# Patient Record
Sex: Female | Born: 1990 | Race: White | Hispanic: No | Marital: Married | Smoking: Never smoker
Health system: Southern US, Community
[De-identification: ages and names within clinical notes are randomized; demographics above are authoritative.]

## PROBLEM LIST (undated history)

## (undated) ENCOUNTER — Inpatient Hospital Stay (HOSPITAL_COMMUNITY): Payer: Self-pay

## (undated) DIAGNOSIS — E063 Autoimmune thyroiditis: Secondary | ICD-10-CM

## (undated) DIAGNOSIS — L509 Urticaria, unspecified: Secondary | ICD-10-CM

## (undated) DIAGNOSIS — N390 Urinary tract infection, site not specified: Secondary | ICD-10-CM

## (undated) DIAGNOSIS — Z789 Other specified health status: Secondary | ICD-10-CM

## (undated) HISTORY — PX: BLADDER SURGERY: SHX569

## (undated) HISTORY — PX: TYMPANOSTOMY TUBE PLACEMENT: SHX32

## (undated) HISTORY — PX: ADENOIDECTOMY: SUR15

## (undated) HISTORY — PX: TUBAL LIGATION: SHX77

## (undated) HISTORY — DX: Urticaria, unspecified: L50.9

## (undated) HISTORY — PX: TONSILLECTOMY: SUR1361

## (undated) HISTORY — PX: HERNIA REPAIR: SHX51

---

## 2017-06-24 ENCOUNTER — Other Ambulatory Visit (HOSPITAL_COMMUNITY): Payer: Self-pay | Admitting: Obstetrics and Gynecology

## 2017-06-24 ENCOUNTER — Encounter (HOSPITAL_COMMUNITY): Payer: Self-pay | Admitting: Obstetrics and Gynecology

## 2017-06-24 DIAGNOSIS — Z3A19 19 weeks gestation of pregnancy: Secondary | ICD-10-CM

## 2017-06-24 DIAGNOSIS — Z3689 Encounter for other specified antenatal screening: Secondary | ICD-10-CM

## 2017-06-24 DIAGNOSIS — R9389 Abnormal findings on diagnostic imaging of other specified body structures: Secondary | ICD-10-CM

## 2017-07-03 ENCOUNTER — Encounter (HOSPITAL_COMMUNITY): Payer: Self-pay | Admitting: *Deleted

## 2017-07-07 ENCOUNTER — Encounter (HOSPITAL_COMMUNITY): Payer: Self-pay

## 2017-07-07 ENCOUNTER — Ambulatory Visit (HOSPITAL_COMMUNITY)
Admission: RE | Admit: 2017-07-07 | Discharge: 2017-07-07 | Disposition: A | Discharge status: Home / Self Care | Payer: BLUE CROSS/BLUE SHIELD | Source: Ambulatory Visit | Attending: Obstetrics and Gynecology | Admitting: Obstetrics and Gynecology

## 2017-07-07 ENCOUNTER — Other Ambulatory Visit (HOSPITAL_COMMUNITY): Payer: Self-pay | Admitting: *Deleted

## 2017-07-07 DIAGNOSIS — Z3689 Encounter for other specified antenatal screening: Secondary | ICD-10-CM

## 2017-07-07 DIAGNOSIS — Z3A19 19 weeks gestation of pregnancy: Secondary | ICD-10-CM

## 2017-07-07 DIAGNOSIS — O359XX0 Maternal care for (suspected) fetal abnormality and damage, unspecified, not applicable or unspecified: Secondary | ICD-10-CM

## 2017-07-07 DIAGNOSIS — O350XX0 Maternal care for (suspected) central nervous system malformation in fetus, not applicable or unspecified: Secondary | ICD-10-CM | POA: Insufficient documentation

## 2017-07-07 DIAGNOSIS — R9389 Abnormal findings on diagnostic imaging of other specified body structures: Secondary | ICD-10-CM

## 2017-07-07 DIAGNOSIS — O3500X0 Maternal care for (suspected) central nervous system malformation or damage in fetus, unspecified, not applicable or unspecified: Secondary | ICD-10-CM | POA: Insufficient documentation

## 2017-07-07 HISTORY — DX: Other specified health status: Z78.9

## 2017-07-07 NOTE — Progress Notes (Addendum)
Genetic Counseling  High-Risk Gestation Note  Appointment Date:  07/07/2017 Referred By: Hassell Done, MD Date of Birth:  1991/09/10   Pregnancy History: G2P1001 Estimated Date of Delivery: 11/30/17 Estimated Gestational Age: [redacted]w[redacted]d Attending: Alpha Gula, MD   I met with Ms. Jasmine Avila and her partner for genetic counseling because of abnormal ultrasound findings.   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  Discussed in utero fetal repair and postnatal surgical repair  Patient understands need to transfer care to deliver at tertiary care facility  Interested in obtaining more information on in utero ONTD repair- we will facilitate consultation with [facility]   May pursue prenatal consultation with neurosurgery at other medical center pending consult with [facility] regarding fetal surgery  Reviewed options for additional screening  Ongoing ultrasound- follow up scheduled in 4 weeks  Reviewed options for diagnostic testing, including risks, benefits, limitations and alternatives  Amniocentesis declined today, but patient understands this is part of eligibility criteria if elect to pursue in utero repair  Reviewed family history concerns  We began by reviewing the ultrasound in detail. Ultrasound today confirmed the finding of a sacral open neural tube defect (NTD) and banana sign (Arnold Chiari II malformation).  The fetal ventricles were not visualized to be dilated at the time of today's ultrasound.  We discussed that these findings are consistent with a diagnosis of open spina bifida.  Remaining visualized fetal anatomy was within normal limits. Complete ultrasound results under separate cover.   We discussed that spina bifida is one of the most common birth differences and affects approximately 1 in 600 babies each year in [place].  We reviewed that the spine contains nerves that control the  legs, bladder, and bowel.  If there is an opening or a change in the development of the spine, the nerves can be damaged or incompletely formed.  We also discussed that babies with spina bifida can have a range of health concerns, which partially depends on where the opening is located along the spine.  Typically, openings in the lower spine cause fewer health concerns than openings in the upper spine.  Individuals with spina bifida may have muscle weakness, some degree of paralysis in the lower limbs, difficulty controlling bladder and bowel function, fluid build-up in the ventricles of the brain (ventriculomegaly), and learning differences.  The degree of paralysis, possible learning problems, and related health concerns that a baby with spina bifida may have cannot be accurately predicted until after birth.    We discussed that in most cases, surgery to close the opening in the spine is performed within the first 24 to 48 hours after birth.  Immediate surgery is required to prevent infection and any further damage to the nerves that are exposed. We discussed the need for delivery at a tertiary care center.  We also discussed the option of in utero NTD repair, which is available at several centers throughout the country. Fetal surgery for ONTDs has been shown to reduce the need for VP shunting and has demonstrated to improve mobility in some cases. We spent some time discussing the eligibility criteria for in utero repair and briefly discussed risks versus benefits. Fetal surgery is typically performed between 24 and [redacted] weeks gestation, and we discussed that this is available at [facility]. Ms. Jasmine Avila and her partner expressed interest in obtaining more information regarding in utero repair, but are currently undecided  if they will elect to pursue this option. They requested a referral for consultation to [facility] to obtain more information about in utero NTD repair. We will facilitate  this referral.   We also discussed the option of this couple meeting with a pediatric neurosurgeon at other medical centers including [place] to discuss and coordinate any necessary surgeries following the delivery, if desired. The couple may consider prenatal consultation with neurosurgery at other locations pending their visit with [facility].   We discussed the many causes of NTDs.  They were informed that spina bifida occurs as an isolated finding, in the majority of cases.  "Isolated" simply means that spina bifida is the only birth difference that happened in the baby.  Isolated NTDs are usually inherited in a multifactorial manner in which there is no prior family history.  Multifactorial conditions have both environmental and genetic factors that contribute to their development.  Both the genetic and environmental factors that contribute to the development of spina bifida are largely unknown; however, some medications and health conditions, such as uncontrolled diabetes and obesity, may increase the chance of spina bifida.  We also discussed the role of folic acid in the development of the neural tube and prevention of spina bifida.  Ms. Jasmine Avila reported that she was not taking any medications that are known to increase the risk of spina bifida.  We also discussed that NTDs may occur as a feature of an underlying genetic syndrome or condition.  Approximately 5-10% of individuals who have spina bifida also have an underlying chromosome condition. Ms. Jasmine Avila previously had first trimester screening, which was within normal limits, reducing the risks for fetal Down syndrome and trisomy 79. We reviewed benefits and limitations of first trimester screening for fetal aneuploidy.  We discussed the option of amniocentesis for fetal chromosome analysis, including the benefits, limitations, and risks.  The couple declined amniocentesis at this time. However, they understand that amniocentesis with  normal chromosome analysis is part of the eligibility criteria if they elect to pursue in utero NTD repair.   Follow-up ultrasound was scheduled in our office for 08/04/17. We provided the couple with written resources from the [place]. They were encouraged to call with additional questions or concerns.   Both family histories were reviewed and found to be contributory for history of congenital heart defect for the patient's father. The patient did not know the specific type of heart defect but reported that her father had open heart surgery as an infant. Her father is reportedly otherwise healthy.  We reviewed that congenital heart defects (CHDs) can be isolated or a feature of an underlying genetic condition. Congenital heart defects are most often multifactorial in etiology, but can also result from chromosome aberrations, single gene conditions, or teratogenic exposures. We discussed that isolated, nonsyndromic CHDs occur in approximately 1% of the general population. If Ms. Jasmine Avila' relative had an isolated CHD, the risk of recurrence for her offspring (second degree relative) is expected to be approximately 1-2%. If however, her relative had an underlying genetic condition that caused the CHD, the risk of recurrence would depend upon the specific condition. Without further information, an accurate risk assessment cannot be provided. We discussed the availability of a detailed anatomy ultrasound and fetal echocardiogram to assess the development of the heart during pregnancy.   Ms. Jasmine Avila also reported a niece (her sister's daughter) with gastroschisis. Her niece has Office manager at [employer]. She is currently 24  years old. 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  We discussed that gastroschisis occurs in approximately 1 in 51,000-10,000 live births, more often in babies born to younger mothers. Smoking during pregnancy has also been associated with increased risk for  gastroschisis.  We reviewed that gastroschisis is considered an abdominal wall defect which is characterized by an opening to the right of the umbilicus through which the intestines and other visceral organs protrude.  The majority of cases of gastroschisis are thought to be sporadic and multifactorial in etiology, with a low risk of recurrence.  However, there have been reports of both autosomal recessive and dominant inheritance. Given the reported family history, recurrence risk for gastroschisis for the patient's offspring would not be expected to be increased above the general population risk. Without further information regarding the provided family history, an accurate genetic risk cannot be calculated. Further genetic counseling is warranted if more information is obtained.  Ms. Jasmine Avila denied exposure to environmental toxins or chemical agents. She denied the use of alcohol, tobacco or street drugs. She denied significant viral illnesses during the course of her pregnancy. Her medical and surgical histories were noncontributory.   I counseled this couple regarding the above risks and available options.  The approximate face-to-face time with the genetic counselor was 40 minutes.  Chipper Oman, MS Certified Genetic Counselor 07/07/2017

## 2017-07-10 ENCOUNTER — Telehealth (HOSPITAL_COMMUNITY): Payer: Self-pay | Admitting: MS"

## 2017-07-10 NOTE — Telephone Encounter (Signed)
Jasmine HumbleErin Avila, Certified Genetic Counselor with [facility] called regarding patient. Jasmine Avila is being [facility] today, [facility]/2017, [facility] regarding fetal ONTD repair. Jasmine Avila was calling to confirm that the patient has not yet had an amniocentesis, which we confirmed today.   Jasmine Avila 07/10/2017 9:00 AM

## 2017-07-10 NOTE — Addendum Note (Signed)
Encounter addended by: Augustin Coupeorneliussen, Mailey Landstrom Louise Ech on: 07/10/2017  8:59 AM<BR>    Actions taken: Sign clinical note

## 2017-07-16 ENCOUNTER — Encounter (HOSPITAL_COMMUNITY): Payer: Self-pay

## 2017-07-20 ENCOUNTER — Encounter (HOSPITAL_COMMUNITY): Payer: Self-pay

## 2017-07-28 ENCOUNTER — Telehealth (HOSPITAL_COMMUNITY): Payer: Self-pay | Admitting: MS"

## 2017-07-28 NOTE — Telephone Encounter (Signed)
Philippa SicksMaya Lindley, coordinator for [facility] fetal [facility] visits called to inform us that Ms. Jory EeKallie G Williams has elected to pursue fetal surgery [facility]D [facility], scheduled for Tuesday 8/21. [facility]. Ms. Clint GuyLindley called to coordinate the patient's first BMZ shot on Sunday, 8/19 [facility], given that this location is more convenient to her, and she will plan to receive her second BM[facility] when [facility]. An order has been [facility] for betamethasone on Sunday, 8/19 at 11:00 am through Maternity Admissions Unit. I called Ms. Clint GuyLindley back and left message regarding this date and time to be communicated to the patient.   Clydie BraunKaren Ozzie Knobel  07/28/2017 1:19 PM

## 2017-08-04 ENCOUNTER — Other Ambulatory Visit (HOSPITAL_COMMUNITY): Payer: Self-pay | Admitting: Maternal and Fetal Medicine

## 2017-08-04 ENCOUNTER — Other Ambulatory Visit (HOSPITAL_COMMUNITY): Payer: Self-pay | Admitting: *Deleted

## 2017-08-04 ENCOUNTER — Encounter (HOSPITAL_COMMUNITY): Payer: Self-pay

## 2017-08-04 ENCOUNTER — Ambulatory Visit (HOSPITAL_COMMUNITY)
Admission: RE | Admit: 2017-08-04 | Discharge: 2017-08-04 | Disposition: A | Discharge status: Home / Self Care | Payer: BLUE CROSS/BLUE SHIELD | Source: Ambulatory Visit | Attending: Obstetrics and Gynecology | Admitting: Obstetrics and Gynecology

## 2017-08-04 DIAGNOSIS — O359XX0 Maternal care for (suspected) fetal abnormality and damage, unspecified, not applicable or unspecified: Secondary | ICD-10-CM | POA: Insufficient documentation

## 2017-08-04 DIAGNOSIS — Z363 Encounter for antenatal screening for malformations: Secondary | ICD-10-CM

## 2017-08-04 DIAGNOSIS — Z3A23 23 weeks gestation of pregnancy: Secondary | ICD-10-CM

## 2017-08-04 DIAGNOSIS — Z362 Encounter for other antenatal screening follow-up: Secondary | ICD-10-CM | POA: Diagnosis not present

## 2017-08-04 DIAGNOSIS — O289 Unspecified abnormal findings on antenatal screening of mother: Secondary | ICD-10-CM | POA: Diagnosis not present

## 2017-08-16 ENCOUNTER — Inpatient Hospital Stay (HOSPITAL_COMMUNITY)
Admission: AD | Admit: 2017-08-16 | Discharge: 2017-08-16 | Disposition: A | Discharge status: Home / Self Care | Payer: BLUE CROSS/BLUE SHIELD | Source: Ambulatory Visit | Attending: Obstetrics & Gynecology | Admitting: Obstetrics & Gynecology

## 2017-08-16 DIAGNOSIS — Z3A30 30 weeks gestation of pregnancy: Secondary | ICD-10-CM | POA: Insufficient documentation

## 2017-08-16 DIAGNOSIS — O26893 Other specified pregnancy related conditions, third trimester: Secondary | ICD-10-CM | POA: Insufficient documentation

## 2017-08-16 MED ORDER — BETAMETHASONE SOD PHOS & ACET 6 (3-3) MG/ML IJ SUSP
12.0000 mg | Freq: Once | INTRAMUSCULAR | Status: AC
  Administered 2017-08-16: 12 mg via INTRAMUSCULAR
  Filled 2017-08-16: qty 2

## 2017-08-16 NOTE — MAU Note (Signed)
Patient here for 1st dose of BMZ.  Denies any complaints at this time.

## 2017-09-01 ENCOUNTER — Ambulatory Visit (HOSPITAL_COMMUNITY): Payer: BLUE CROSS/BLUE SHIELD

## 2017-09-01 ENCOUNTER — Encounter (HOSPITAL_COMMUNITY): Payer: Self-pay

## 2017-11-09 MED ORDER — BISACODYL 10 MG RE SUPP
10.00 mg | RECTAL | Status: DC
Start: ? — End: 2017-11-09

## 2017-11-09 MED ORDER — SIMETHICONE 80 MG PO CHEW
80.00 mg | CHEWABLE_TABLET | ORAL | Status: DC
Start: ? — End: 2017-11-09

## 2017-11-09 MED ORDER — BISACODYL 5 MG PO TBEC
5.00 mg | DELAYED_RELEASE_TABLET | ORAL | Status: DC
Start: ? — End: 2017-11-09

## 2017-11-09 MED ORDER — NALBUPHINE HCL 10 MG/ML IJ SOLN
10.00 mg | INTRAMUSCULAR | Status: DC
Start: ? — End: 2017-11-09

## 2017-11-09 MED ORDER — MAGNESIUM HYDROXIDE 400 MG/5ML PO SUSP
30.00 | ORAL | Status: DC
Start: ? — End: 2017-11-09

## 2017-11-09 MED ORDER — DEXTROSE IN LACTATED RINGERS 5 % IV SOLN
125.00 | INTRAVENOUS | Status: DC
Start: ? — End: 2017-11-09

## 2017-11-09 MED ORDER — NALBUPHINE HCL 10 MG/ML IJ SOLN
2.50 mg | INTRAMUSCULAR | Status: DC
Start: ? — End: 2017-11-09

## 2017-11-09 MED ORDER — ONDANSETRON 4 MG PO TBDP
4.00 mg | ORAL_TABLET | ORAL | Status: DC
Start: ? — End: 2017-11-09

## 2017-11-09 MED ORDER — DIPHENHYDRAMINE HCL 25 MG PO CAPS
25.00 mg | ORAL_CAPSULE | ORAL | Status: DC
Start: ? — End: 2017-11-09

## 2017-11-09 MED ORDER — ACETAMINOPHEN 325 MG PO TABS
650.00 mg | ORAL_TABLET | ORAL | Status: DC
Start: 2017-11-09 — End: 2017-11-09

## 2017-11-09 MED ORDER — MORPHINE SULFATE (PF) 4 MG/ML IV SOLN
2.00 mg | INTRAVENOUS | Status: DC
Start: ? — End: 2017-11-09

## 2017-11-09 MED ORDER — DOCUSATE SODIUM 100 MG PO CAPS
100.00 mg | ORAL_CAPSULE | ORAL | Status: DC
Start: ? — End: 2017-11-09

## 2017-11-09 MED ORDER — GENERIC EXTERNAL MEDICATION
Status: DC
Start: ? — End: 2017-11-09

## 2017-11-09 MED ORDER — NALOXONE HCL 4 MG/10ML IJ SOLN
0.40 mg | INTRAMUSCULAR | Status: DC
Start: ? — End: 2017-11-09

## 2017-11-09 MED ORDER — GENERIC EXTERNAL MEDICATION
15.00 | Status: DC
Start: ? — End: 2017-11-09

## 2017-11-09 MED ORDER — IRON PO
1.00 | ORAL | Status: DC
Start: 2017-11-10 — End: 2017-11-09

## 2017-11-09 MED ORDER — IBUPROFEN 600 MG PO TABS
600.00 mg | ORAL_TABLET | ORAL | Status: DC
Start: 2017-11-09 — End: 2017-11-09

## 2018-02-22 ENCOUNTER — Encounter (HOSPITAL_COMMUNITY): Payer: Self-pay

## 2021-06-26 ENCOUNTER — Encounter (HOSPITAL_COMMUNITY): Payer: Self-pay

## 2021-06-26 ENCOUNTER — Emergency Department (HOSPITAL_COMMUNITY): Payer: Medicaid Other

## 2021-06-26 ENCOUNTER — Emergency Department (HOSPITAL_COMMUNITY)
Admission: EM | Admit: 2021-06-26 | Discharge: 2021-06-26 | Disposition: A | Discharge status: Home / Self Care | Payer: Medicaid Other | Attending: Emergency Medicine | Admitting: Emergency Medicine

## 2021-06-26 DIAGNOSIS — M79601 Pain in right arm: Secondary | ICD-10-CM | POA: Diagnosis not present

## 2021-06-26 DIAGNOSIS — M25512 Pain in left shoulder: Secondary | ICD-10-CM | POA: Insufficient documentation

## 2021-06-26 DIAGNOSIS — N644 Mastodynia: Secondary | ICD-10-CM | POA: Insufficient documentation

## 2021-06-26 DIAGNOSIS — M542 Cervicalgia: Secondary | ICD-10-CM | POA: Diagnosis present

## 2021-06-26 DIAGNOSIS — M25511 Pain in right shoulder: Secondary | ICD-10-CM | POA: Diagnosis not present

## 2021-06-26 DIAGNOSIS — M5412 Radiculopathy, cervical region: Secondary | ICD-10-CM | POA: Insufficient documentation

## 2021-06-26 MED ORDER — METHYLPREDNISOLONE 4 MG PO TBPK: ORAL_TABLET | ORAL | 0 refills | Status: DC

## 2021-06-26 MED ORDER — DEXAMETHASONE SODIUM PHOSPHATE 10 MG/ML IJ SOLN
10.0000 mg | Freq: Once | INTRAMUSCULAR | Status: AC
  Administered 2021-06-26: 10 mg via INTRAMUSCULAR
  Filled 2021-06-26: qty 1

## 2021-06-26 MED ORDER — KETOROLAC TROMETHAMINE 60 MG/2ML IM SOLN
60.0000 mg | Freq: Once | INTRAMUSCULAR | Status: AC
  Administered 2021-06-26: 60 mg via INTRAMUSCULAR
  Filled 2021-06-26: qty 2

## 2021-06-26 NOTE — Discharge Instructions (Addendum)
You were evaluated in the Emergency Department and after careful evaluation, we did not find any emergent condition requiring admission or further testing in the hospital.  Please take the steroid pack as directed until finished.  Continue with the anti-inflammatories and muscle relaxers.  Please follow-up with your primary care doctor.  Please return to the Emergency Department if you experience any worsening of your condition.  Thank you for allowing [place] to be a part of your care.

## 2021-06-26 NOTE — ED Provider Notes (Signed)
[facility]-EMERGENCY DEPT Provider Note   CSN: [id number] Arrival date & time: 06/26/21  1335     History Chief Complaint  Patient presents with   Neck Pain   Back Pain    Jasmine Avila is a 30 y.o. female.  HPI 30 year old female with past medical history presents to the ER with complaints of neck pain radiating out into her trap down her shoulder and into her armpit and sometimes right breast.  This started about a week ago.  She was seen by her PCP who thought it was anxiety, started on Lexapro which she had bad side effects to.  She stopped taking it yesterday.  She was seen at [facility] last night and had some blood work done and a UA, given anti-inflammatories and a muscle relaxer.  She states she did take these medications, the muscle laxer did help a little bit but she still continues to get pain and feels like she cannot get comfortable she denies any weakness.  No fevers or chills.  No history of IV drug use.  No known injuries or falls.    Past Medical History:  Diagnosis Date   Medical history non-contributory     Patient Active Problem List   Diagnosis Date Noted   Fetal neural tube defect affecting pregnancy 07/07/2017    Past Surgical History:  Procedure Laterality Date   BLADDER SURGERY     HERNIA REPAIR     TONSILLECTOMY       OB History     Gravida  2   Para  1   Term  1   Preterm      AB      Living  1      SAB      IAB      Ectopic      Multiple      Live Births              No family history on file.  Social History   Tobacco Use   Smoking status: Never   Smokeless tobacco: Never  Substance Use Topics   Alcohol use: No   Drug use: No    Home Medications Prior to Admission medications   Medication Sig Start Date End Date Taking? Authorizing Provider  methylPREDNISolone (MEDROL DOSEPAK) 4 MG TBPK tablet Take as directed until finished 06/26/21  Yes Madisin Hasan, Antony Salmon, PA-C  Prenatal  Vit-Fe Fumarate-FA (PRENATAL MULTIVITAMIN) TABS tablet Take 1 tablet by mouth daily at 12 noon.    [provider]    Allergies    Sulfa antibiotics  Review of Systems   Review of Systems Ten systems reviewed and are negative for acute change, except as noted in the HPI.   Physical Exam Updated Vital Signs BP 117/80 (BP Location: Right Arm)   Pulse 82   Temp 98.1 F (36.7 C) (Oral)   Resp 18   LMP 06/19/2021 (Approximate)   SpO2 100%   Physical Exam Vitals and nursing note reviewed.  Constitutional:      General: She is not in acute distress.    Appearance: She is well-developed.  HENT:     Head: Normocephalic and atraumatic.  Eyes:     Conjunctiva/sclera: Conjunctivae normal.  Cardiovascular:     Rate and Rhythm: Normal rate and regular rhythm.     Heart sounds: No murmur heard. Pulmonary:     Effort: Pulmonary effort is normal. No respiratory distress.     Breath  sounds: Normal breath sounds.  Abdominal:     Palpations: Abdomen is soft.     Tenderness: There is no abdominal tenderness.  Musculoskeletal:        General: Tenderness present. No swelling, deformity or signs of injury.     Cervical back: Neck supple.     Comments: Mild midline tenderness of cervical spine, full flexion extension of the neck.  No noticeable step-offs or crepitus.  5/5 strength in upper lower extremities bilaterally.  No visible rashes, no visible erythema, warmth, fluctuance to the right armpit or right breast.  Skin:    General: Skin is warm and dry.     Findings: No rash.  Neurological:     General: No focal deficit present.     Mental Status: She is alert and oriented to person, place, and time.     Sensory: No sensory deficit.     Motor: No weakness.    ED Results / Procedures / Treatments   Labs (all labs ordered are listed, but only abnormal results are displayed) Labs Reviewed - No data to display  EKG None  Radiology DG Cervical Spine Complete  Result Date:  06/26/2021 CLINICAL DATA:  Cervicalgia with radicular symptoms EXAM: CERVICAL SPINE - COMPLETE 4+ VIEW COMPARISON:  None. FINDINGS: Frontal, lateral, open-mouth odontoid, and bilateral oblique views were obtained. There is no fracture or spondylolisthesis. Prevertebral soft tissues and predental space regions are normal. The disc spaces appear normal. There is no appreciable exit foraminal narrowing on the oblique views. Lung apices are clear. IMPRESSION: No fracture or spondylolisthesis.  No evident arthropathy. Electronically Signed   By: Bretta Bang III M.D.   On: 06/26/2021 14:58   DG Chest Portable 1 View  Result Date: 06/26/2021 CLINICAL DATA:  Chest pain EXAM: PORTABLE CHEST 1 VIEW COMPARISON:  June 02, 2021 FINDINGS: The lungs are clear. The heart size and pulmonary vascularity are normal. No adenopathy. No pneumothorax. Slight lower thoracic levoscoliosis noted. IMPRESSION: Lungs clear.  Heart size normal. Electronically Signed   By: Bretta Bang III M.D.   On: 06/26/2021 14:58    Procedures Procedures   Medications Ordered in ED Medications  ketorolac (TORADOL) injection 60 mg (60 mg Intramuscular Given 06/26/21 1525)  dexamethasone (DECADRON) injection 10 mg (10 mg Intramuscular Given 06/26/21 1525)    ED Course  I have reviewed the triage vital signs and the nursing notes.  Pertinent labs & imaging results that were available during my care of the patient were reviewed by me and considered in my medical decision making (see chart for details).    MDM Rules/Calculators/A&P                          30 year old female with right-sided neck pain radiating into her shoulders and into her armpit/breast.  It is reassuring, able midline cervical tenderness.  Full range of motion of neck, no meningeal signs.  No overlying step-offs, crepitus.  5/5 strength in upper and lower extremities bilaterally.  No visible abscesses, rashes to the back, armpit, no visible overlying erythema to  the breast, no cysts or masses noted.  Patient comes do appear to be radicular.  Plain films without any abnormalities.  Low suspicion for meningitis.  Suspicion for limb threatening disc herniation.  Patient received Toradol, Decadron shot here and Medrol Dosepak to take home.  Encouraged her to continue to take the muscle relaxer and anti-inflammatories.  Encouraged PCP follow-up.  We discussed return precautions.  Her questions have been answered to her satisfaction, she voiced understanding and is agreeable.  Stable for discharge. Final Clinical Impression(s) / ED Diagnoses Final diagnoses:  Neck pain  Cervical radiculopathy    Rx / DC Orders ED Discharge Orders          Ordered    methylPREDNISolone (MEDROL DOSEPAK) 4 MG TBPK tablet        06/26/21 1545             Leone Brand 06/26/21 1547    Cheryll Cockayne, MD 07/05/21 [phone]

## 2021-06-26 NOTE — ED Triage Notes (Signed)
C/o right shoulder pain/ neck pain that radiates down to right breast and down right side of back.   Saw PCP and was told it could be anxiety and started on Lexapro with no relief.   Patient had some relief with muscle relaxer's.  Patient also taking ibuprofen.    A/Ox4 Ambulatory in triage

## 2022-07-01 IMAGING — CR DG CERVICAL SPINE COMPLETE 4+V
5 series · 5 of 5 positions shown · non-contrast
Comparison: None.

CLINICAL DATA: Cervicalgia with radicular symptoms

EXAM:
CERVICAL SPINE - COMPLETE 4+ VIEW

[w cervical spine lat]
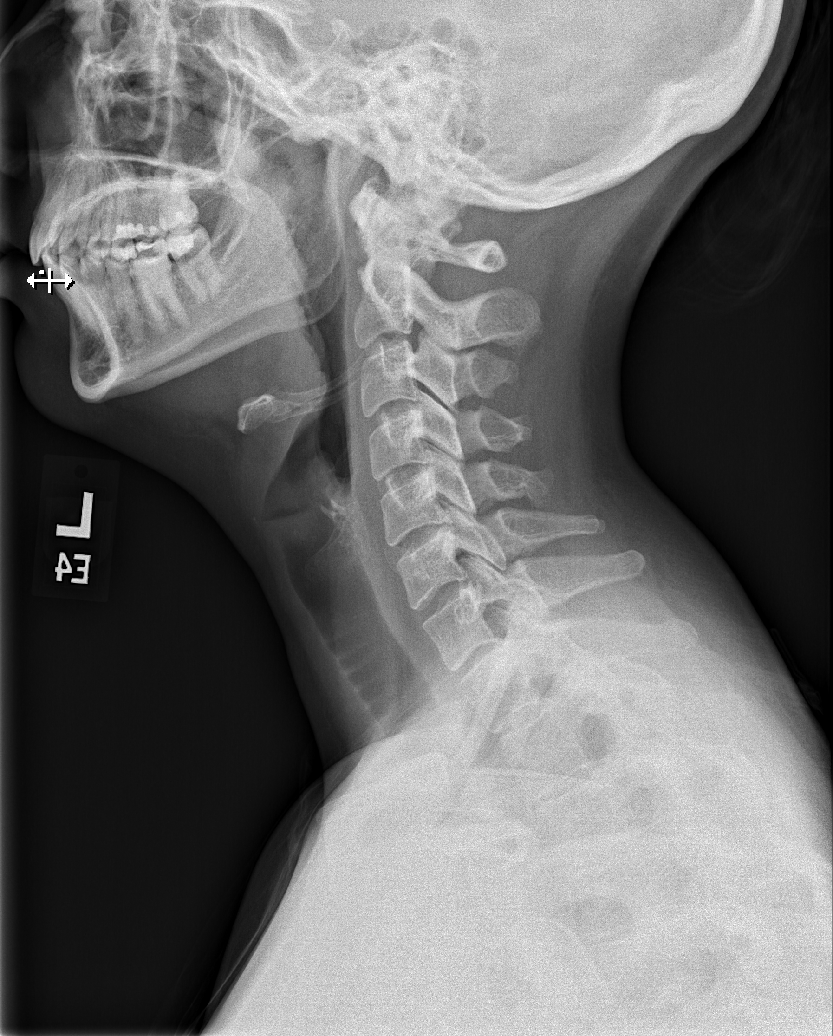

[w cervical spine ap_obl (1 of 2)]
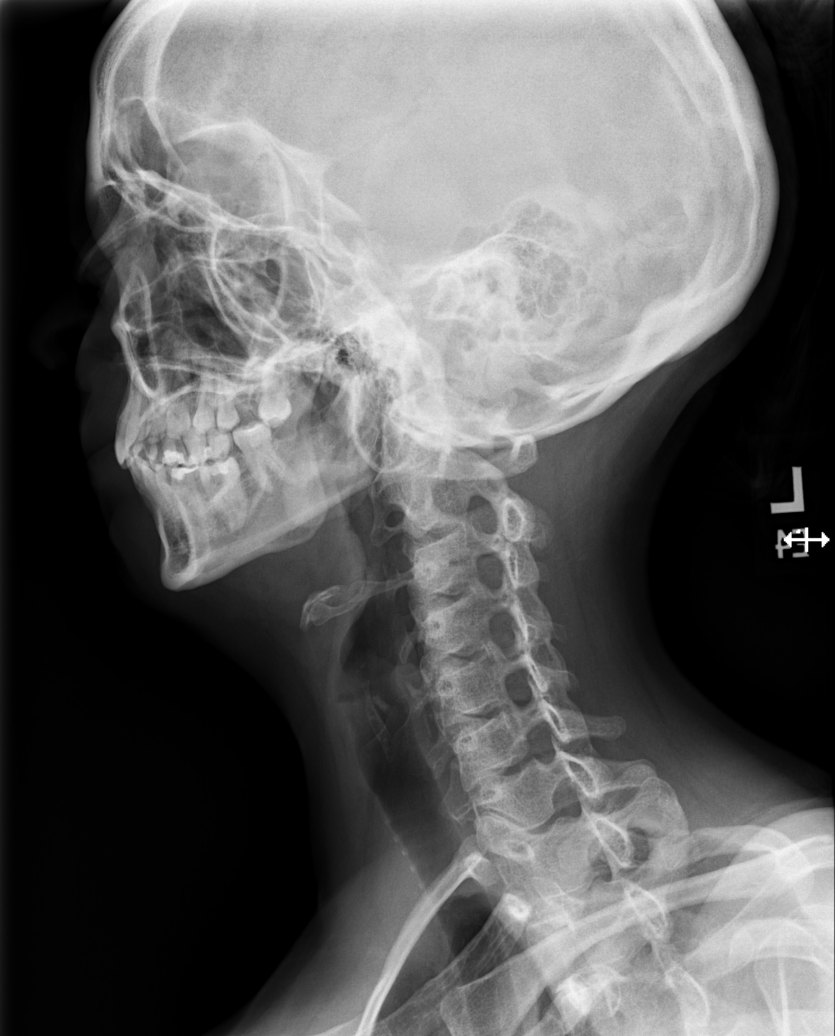

[w cervical spine ap_obl (2 of 2)]
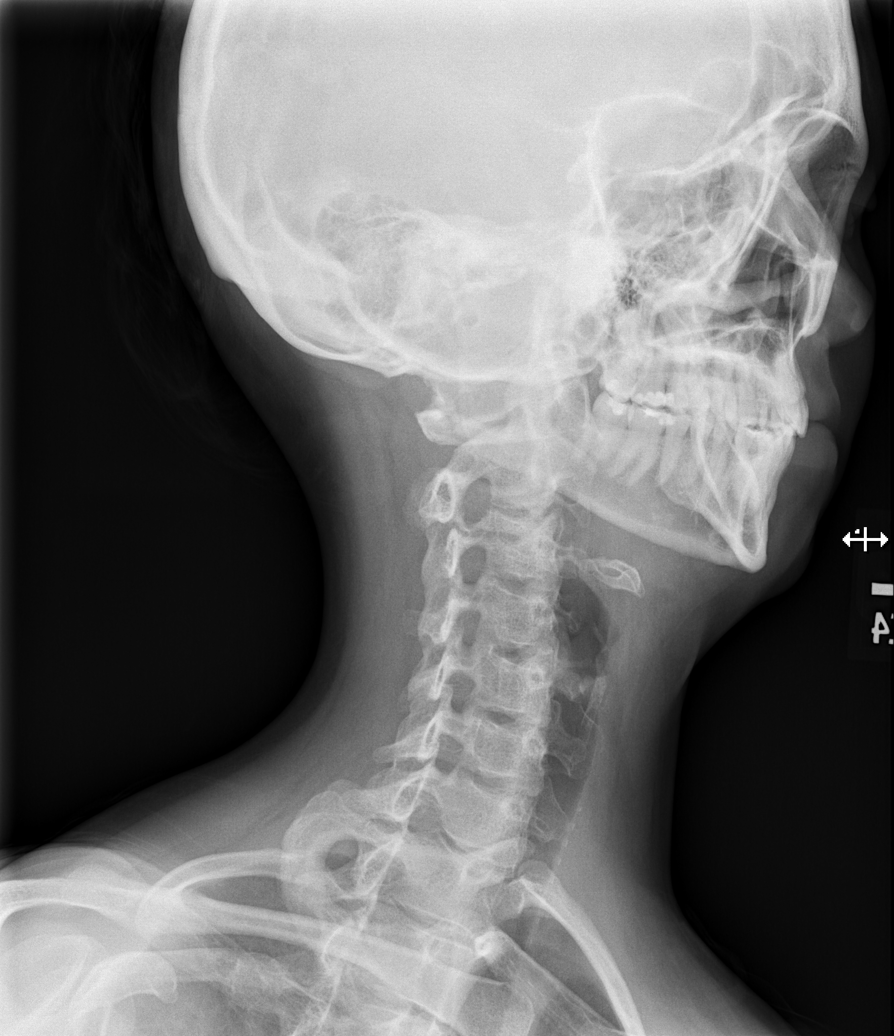

[w cervical spine ap]
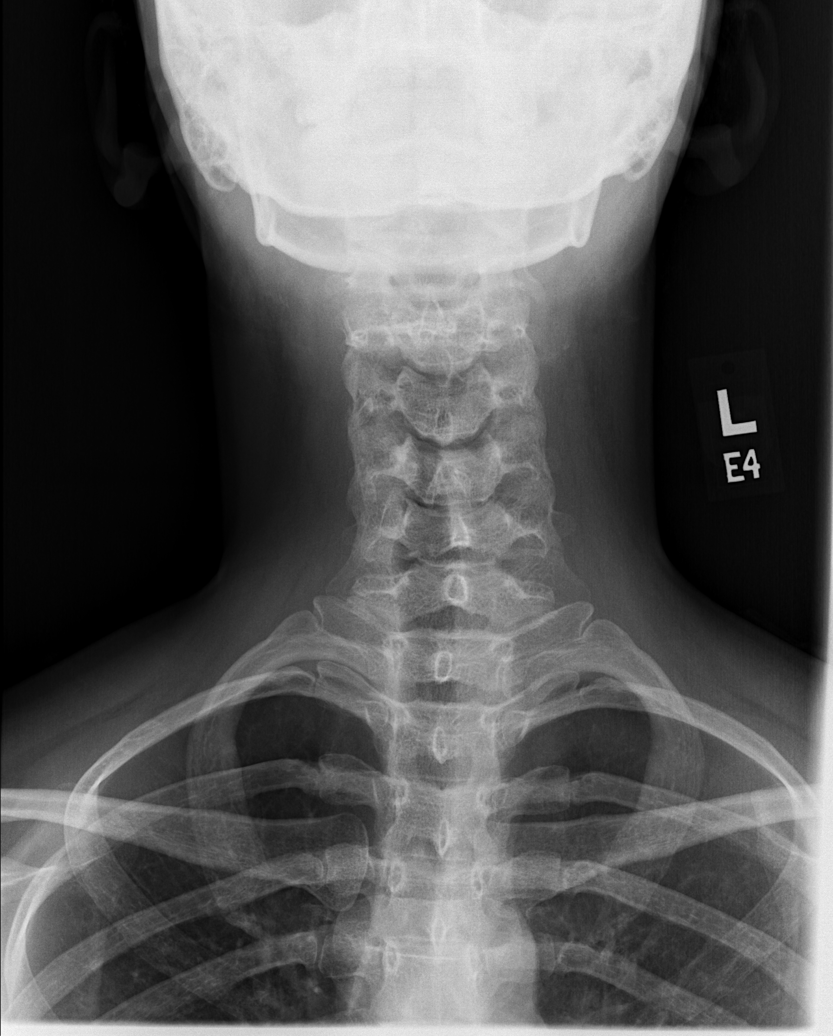

[w cervical spine odontoid]
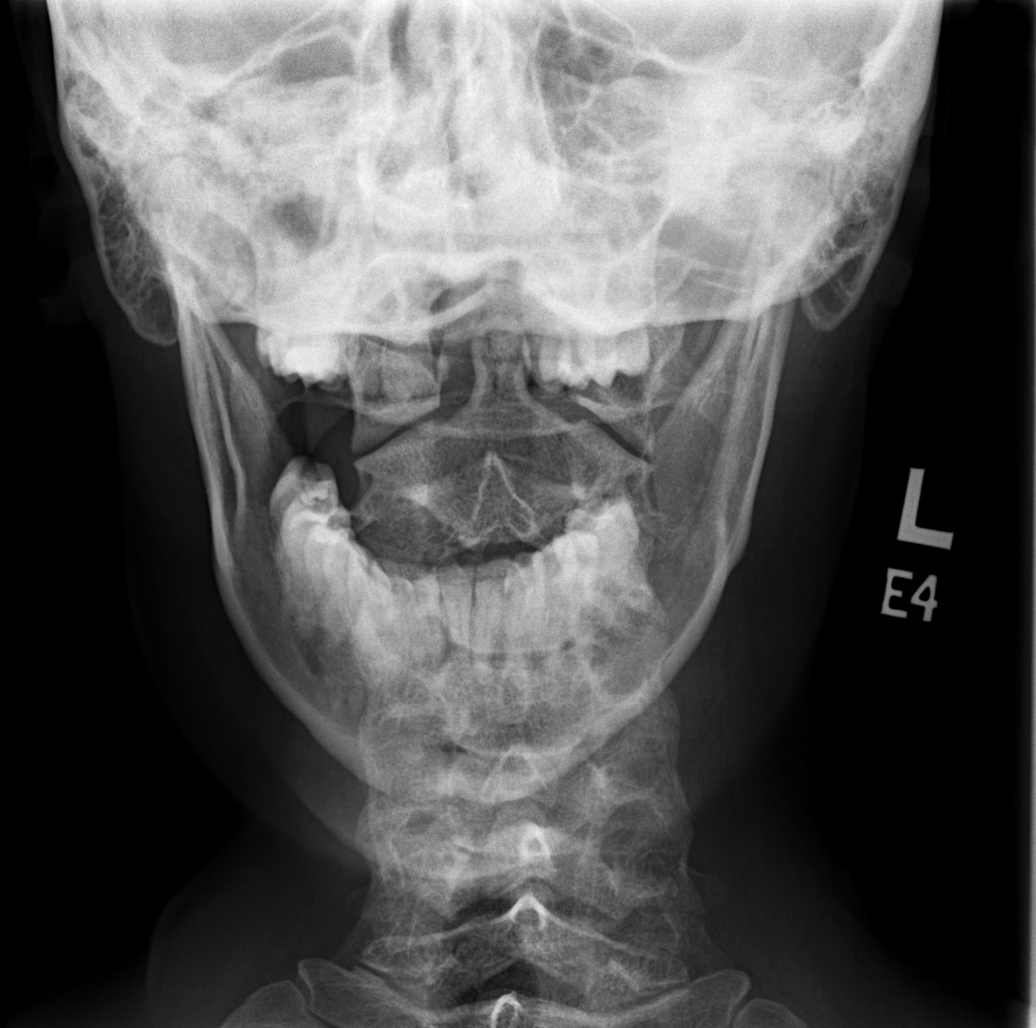

[5 of 5 positions shown; findings below may reference images not displayed]

FINDINGS: Frontal, lateral, open-mouth odontoid, and bilateral oblique views
were obtained. There is no fracture or spondylolisthesis.
Prevertebral soft tissues and predental space regions are normal.
The disc spaces appear normal. There is no appreciable exit
foraminal narrowing on the oblique views. Lung apices are clear.
IMPRESSION: No fracture or spondylolisthesis.  No evident arthropathy.

## 2022-09-03 ENCOUNTER — Other Ambulatory Visit: Payer: Self-pay | Admitting: Family Medicine

## 2022-09-03 DIAGNOSIS — R102 Pelvic and perineal pain: Secondary | ICD-10-CM

## 2022-09-25 ENCOUNTER — Other Ambulatory Visit: Payer: Medicaid Other

## 2022-10-03 ENCOUNTER — Ambulatory Visit
Admission: RE | Admit: 2022-10-03 | Discharge: 2022-10-03 | Disposition: A | Discharge status: Home / Self Care | Payer: Medicaid Other | Source: Ambulatory Visit | Attending: Family Medicine | Admitting: Family Medicine

## 2022-10-03 DIAGNOSIS — R102 Pelvic and perineal pain: Secondary | ICD-10-CM

## 2022-10-03 MED ORDER — IOPAMIDOL (ISOVUE-300) INJECTION 61%
120.0000 mL | Freq: Once | INTRAVENOUS | Status: AC | PRN
  Administered 2022-10-03: 120 mL via INTRAVENOUS

## 2022-11-28 HISTORY — PX: OTHER SURGICAL HISTORY: SHX169

## 2023-01-12 NOTE — Progress Notes (Unsigned)
New Patient Note  RE: Jasmine Avila MRN: [id number] DOB: 1991/09/18 Date of Office Visit: 01/13/2023  Consult requested by: [place]* Primary care provider: [facility]  Chief Complaint: Other (After scratching her skin gets red and irritated. /After starting pantoprazole burning,itchy,achey sensation ) and Urticaria  History of Present Illness: I had the pleasure of seeing Jasmine Avila for initial evaluation at the [facility] [place] [facility] [place] on 01/13/2023. She is a 32 y.o. female, who is self-referred here for the evaluation of rash.  Rash/itching started about July 2022 after she started pantoprazole for ulcers. This can occur anywhere on her body. Describes them as itchy, burning, rash, flat. Individual rashes lasts about 30 minutes. No ecchymosis upon resolution. Associated symptoms include: she has been having some GI issues such as nausea.  Frequency of episodes: daily. Suspected triggers are pressure. Denies any fevers, chills, foods, personal care products or recent infections. She has tried the following therapies: antihistamines with unknown benefit. Systemic steroids steroid injection by dermatology with unknown benefit. Currently on allegra 180mg  2 tablets in the mornings.   Previous work up includes: saw neurology for the burning/itching sensation for this - work up unremarkable. 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 She was started on allegra and triamcinolone with no major benefit. She is actually breaking out from the triamcinolone. She also tried multiple anti anxiety meds but it made her suicidal.   Previous history of rash/hives: no. Patient is up to date with the following cancer screening tests: physical exam, pap smears.  Patient's ulcer has resolved and no longer on PPIs.  Assessment and Plan: Jasmine Avila is a 32 y.o. female with: Dermatographic urticaria Noted pruritus and breaking out in rash after  started on PPI for ulcers in July 2022. Rash last for 30 minutes after stroking area but has daily pruritus symptoms. Sometimes also feels nauseous with this. Stopped PPI over 3 months ago and still has symptoms. Saw neurology and work up negative. Dermatology started her on triamcinolone which is causing a rash on her chest and allegra 2 pills with minimal benefit. Also had steroid injection with no benefit. Concerned about allergic triggers. Ulcer resolved. 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  Start allegra 180mg  twice a day.  If symptoms are not controlled or causes drowsiness let us know. Start Pepcid (famotidine) 20mg  twice a day - this may also help her GI symptoms.  Avoid the following potential triggers: alcohol, tight clothing, NSAIDs, hot showers and getting overheated. See below for proper skin care. Stop using topical cream.  Get bloodwork to rule out other etiologies.  If no improvement in 2 weeks, will add on Singulair next - patient was hesitant due to medication's black box warning and some of the antianxiety/depressants she tried in the past made her suicidal.   Gastroesophageal reflux disease History of ulcers and was on PPI.  See handout for lifestyle and dietary modifications. The famotidine as above should also help with these symptoms.   Return in about 4 weeks (around 02/10/2023).  Meds ordered this encounter  Medications   famotidine (PEPCID) 20 MG tablet    Sig: Take 1 tablet (20 mg total) by mouth 2 (two) times daily.    Dispense:  60 tablet    Refill:  3   Lab Orders         Allergens w/Total IgE Area 2         Alpha-Gal Panel  ANA w/Reflex         C3 and C4         CBC with Differential/Platelet         Chronic Urticaria         Comprehensive metabolic panel         C-reactive protein         Sedimentation rate         Thyroid Cascade Profile         Tryptase         Food Allergy Profile      Other allergy screening: Asthma:  no Rhino conjunctivitis: allergic conjunctivitis in the spring - sometimes takes OTC antihistamines prn with good benefit.  Food allergy: no Medication allergy: yes Sulfa - rash at age 49.  Hymenoptera allergy: no Eczema:no History of recurrent infections suggestive of immunodeficency: no  Diagnostics: Unable to skin test today as [facility] won't allow new patient visits and procedures on the same day.   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     famotidine (PEPCID) 20 MG tablet Take 1 tablet (20 mg total) by mouth 2 (two) times daily. 60 tablet 3   Fexofenadine HCl (ALLEGRA PO) Take by mouth.     ondansetron (ZOFRAN) 8 MG tablet Take 16 mg by mouth 2 (two) times daily.     No current facility-administered medications for this visit.   Allergies: Allergies  Allergen Reactions   Sulfa Antibiotics Hives   Social History: Social History   Socioeconomic History   Marital status: Single    Spouse name: Not on file   Number of children: Not on file   Years of education: Not on file   Highest education level: Not on file  Occupational History   Not on file  Tobacco Use   Smoking status: Never   Smokeless tobacco: Never  Substance and Sexual Activity   Alcohol  use: No   Drug use: No   Sexual activity: Not on file  Other Topics Concern   Not on file  Social History Narrative   Not on file   Social Determinants of Health   Financial Resource Strain: Not on file  Food Insecurity: Not on file  Transportation Needs: Not on file  Physical Activity: Not on file  Stress: Not on file  Social Connections: Not on file   Lives in a house built in 1991. Smoking: denies Occupation: Pension scheme manager HistorySurveyor, minerals in the house: no Engineer, civil (consulting) in the family room: no Carpet in the bedroom: yes Heating: gas Cooling: central Pet: no  Family History: Family History  Problem Relation Age of Onset   Urticaria Mother    Allergic rhinitis Sister    Asthma Brother    Review of  Systems  Constitutional:  Negative for appetite change, chills, fever and unexpected weight change.  HENT:  Negative for congestion and rhinorrhea.   Eyes:  Negative for itching.  Respiratory:  Negative for cough, chest tightness, shortness of breath and wheezing.   Cardiovascular:  Negative for chest pain.  Gastrointestinal:  Positive for abdominal pain and nausea.  Genitourinary:  Negative for difficulty urinating.  Skin:  Positive for rash.  Neurological:  Negative for headaches.    Objective: BP 104/66   Pulse 95   Temp 97.7 F (36.5 C)   Resp 18   Ht 5' 6.54" (1.69 m)   Wt 131 lb 8 oz (59.6 kg)   SpO2 98%   BMI 20.88 kg/m  Body mass index is 20.88 kg/m. Physical Exam Vitals and nursing note reviewed.  Constitutional:      Appearance: Normal appearance. She is well-developed.  HENT:     Head: Normocephalic and atraumatic.     Right Ear: Tympanic membrane and external ear normal.     Left Ear: Tympanic membrane and external ear normal.     Nose: Nose normal.     Mouth/Throat:     Mouth: Mucous membranes are moist.     Pharynx: Oropharynx is clear.  Eyes:     Conjunctiva/sclera: Conjunctivae normal.  Cardiovascular:      Rate and Rhythm: Normal rate and regular rhythm.     Heart sounds: Normal heart sounds. No murmur heard.    No friction rub. No gallop.  Pulmonary:     Effort: Pulmonary effort is normal.     Breath sounds: Normal breath sounds. No wheezing, rhonchi or rales.  Musculoskeletal:     Cervical back: Neck supple.  Skin:    General: Skin is warm.     Findings: No rash.     Comments: +1 dermatographism. Erythematous papular rash on anterior chest.   Neurological:     Mental Status: She is alert and oriented to person, place, and time.  Psychiatric:        Behavior: Behavior normal.   The plan was reviewed with the patient/family, and all questions/concerned were addressed.  It was my pleasure to see Jasmine Avila today and participate in her care. Please feel free to contact me with any questions or concerns.  Sincerely,  Rexene Alberts, DO Allergy & Immunology  [facility] office: [place] office: [phone]

## 2023-01-13 ENCOUNTER — Ambulatory Visit (INDEPENDENT_AMBULATORY_CARE_PROVIDER_SITE_OTHER): Payer: 59 | Admitting: Allergy

## 2023-01-13 ENCOUNTER — Encounter: Payer: Self-pay | Admitting: Allergy

## 2023-01-13 VITALS — BP 104/66 | HR 95 | Temp 97.7°F | Resp 18 | Ht 66.54 in | Wt 131.5 lb

## 2023-01-13 DIAGNOSIS — K219 Gastro-esophageal reflux disease without esophagitis: Secondary | ICD-10-CM | POA: Diagnosis not present

## 2023-01-13 DIAGNOSIS — J3089 Other allergic rhinitis: Secondary | ICD-10-CM

## 2023-01-13 DIAGNOSIS — T781XXD Other adverse food reactions, not elsewhere classified, subsequent encounter: Secondary | ICD-10-CM

## 2023-01-13 DIAGNOSIS — L509 Urticaria, unspecified: Secondary | ICD-10-CM | POA: Insufficient documentation

## 2023-01-13 DIAGNOSIS — L299 Pruritus, unspecified: Secondary | ICD-10-CM

## 2023-01-13 DIAGNOSIS — L503 Dermatographic urticaria: Secondary | ICD-10-CM | POA: Diagnosis not present

## 2023-01-13 MED ORDER — FAMOTIDINE 20 MG PO TABS: 20.0000 mg | ORAL_TABLET | Freq: Two times a day (BID) | ORAL | 3 refills | Status: DC

## 2023-01-13 NOTE — Assessment & Plan Note (Signed)
History of ulcers and was on PPI.  See handout for lifestyle and dietary modifications. The famotidine as above should also help with these symptoms.

## 2023-01-13 NOTE — Assessment & Plan Note (Signed)
Noted pruritus and breaking out in rash after started on PPI for ulcers in July 2022. Rash last for 30 minutes after stroking area but has daily pruritus symptoms. Sometimes also feels nauseous with this. Stopped PPI over 3 months ago and still has symptoms. Saw neurology and work up negative. Dermatology started her on triamcinolone which is causing a rash on her chest and allegra 2 pills with minimal benefit. Also had steroid injection with no benefit. Concerned about allergic triggers. Ulcer resolved. 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  Start allegra 180mg  twice a day.  If symptoms are not controlled or causes drowsiness let us know. Start Pepcid (famotidine) 20mg  twice a day - this may also help her GI symptoms.  Avoid the following potential triggers: alcohol, tight clothing, NSAIDs, hot showers and getting overheated. See below for proper skin care. Stop using topical cream.  Get bloodwork to rule out other etiologies.  If no improvement in 2 weeks, will add on Singulair next - patient was hesitant due to medication's black box warning and some of the antianxiety/depressants she tried in the past made her suicidal.

## 2023-01-13 NOTE — Patient Instructions (Addendum)
Skin:  Start allegra 180mg  twice a day.  If symptoms are not controlled or causes drowsiness let us know. Start pepcid (famotidine) 20mg  twice a day.  Avoid the following potential triggers: alcohol, tight clothing, NSAIDs, hot showers and getting overheated. See below for proper skin care. Stop using topical cream.  Get bloodwork:  We are ordering labs, so please allow 1-2 weeks for the results to come back. With the newly implemented Cures Act, the labs might be visible to you at the same time that they become visible to me. However, I will not address the results until all of the results are back, so please be patient.    Heartburn: See handout for lifestyle and dietary modifications. He famotidine as above should also help with these symptoms.   Follow up in 1 month or sooner if needed.  In 2 weeks if not improved, then let me know and we will add on Singulair next.  Skin care recommendations  Bath time: Always use lukewarm water. AVOID very hot or cold water. Keep bathing time to 5-10 minutes. Do NOT use bubble bath. Use a mild soap and use just enough to wash the dirty areas. Do NOT scrub skin vigorously.  After bathing, pat dry your skin with a towel. Do NOT rub or scrub the skin.  Moisturizers and prescriptions:  ALWAYS apply moisturizers immediately after bathing (within 3 minutes). This helps to lock-in moisture. Use the moisturizer several times a day over the whole body. Good summer moisturizers include: Aveeno, CeraVe, Cetaphil. Good winter moisturizers include: Aquaphor, Vaseline, Cerave, Cetaphil, Eucerin, Vanicream. When using moisturizers along with medications, the moisturizer should be applied about one hour after applying the medication to prevent diluting effect of the medication or moisturize around where you applied the medications. When not using medications, the moisturizer can be continued twice daily as maintenance.  Laundry and clothing: Avoid laundry  products with added color or perfumes. Use unscented hypo-allergenic laundry products such as Tide free, Cheer free & gentle, and All free and clear.  If the skin still seems dry or sensitive, you can try double-rinsing the clothes. Avoid tight or scratchy clothing such as wool. Do not use fabric softeners or dyer sheets.

## 2023-01-20 ENCOUNTER — Ambulatory Visit: Payer: Self-pay | Admitting: Allergy

## 2023-01-21 LAB — COMPREHENSIVE METABOLIC PANEL
ALT: 16 IU/L (ref 0–32)
AST: 16 IU/L (ref 0–40)
Albumin/Globulin Ratio: 2.6 — ABNORMAL HIGH (ref 1.2–2.2)
Albumin: 4.9 g/dL (ref 4.0–5.0)
Alkaline Phosphatase: 44 IU/L (ref 44–121)
BUN/Creatinine Ratio: 15 (ref 9–23)
BUN: 9 mg/dL (ref 6–20)
Bilirubin Total: 0.4 mg/dL (ref 0.0–1.2)
CO2: 24 mmol/L (ref 20–29)
Calcium: 9.8 mg/dL (ref 8.7–10.2)
Chloride: 100 mmol/L (ref 96–106)
Creatinine, Ser: 0.61 mg/dL (ref 0.57–1.00)
Globulin, Total: 1.9 g/dL (ref 1.5–4.5)
Glucose: 94 mg/dL (ref 70–99)
Potassium: 3.8 mmol/L (ref 3.5–5.2)
Sodium: 140 mmol/L (ref 134–144)
Total Protein: 6.8 g/dL (ref 6.0–8.5)
eGFR: 124 mL/min/{1.73_m2} (ref >59)

## 2023-01-21 LAB — ALPHA-GAL PANEL
Allergen Lamb IgE: <0.1 kU/L
Beef IgE: <0.1 kU/L
IgE (Immunoglobulin E), Serum: 38 IU/mL (ref 6–495)
O215-IgE Alpha-Gal: <0.1 kU/L
Pork IgE: <0.1 kU/L

## 2023-01-21 LAB — ALLERGENS W/TOTAL IGE AREA 2
Alternaria Alternata IgE: <0.1 kU/L
Aspergillus Fumigatus IgE: <0.1 kU/L
Bermuda Grass IgE: 0.14 kU/L — AB
Cat Dander IgE: <0.1 kU/L
Cedar, Mountain IgE: <0.1 kU/L
Cladosporium Herbarum IgE: <0.1 kU/L
Cockroach, German IgE: <0.1 kU/L
Common Silver Birch IgE: 0.29 kU/L — AB
Cottonwood IgE: <0.1 kU/L
D Farinae IgE: <0.1 kU/L
D Pteronyssinus IgE: 0.13 kU/L — AB
Dog Dander IgE: <0.1 kU/L
Elm, American IgE: <0.1 kU/L
Johnson Grass IgE: 0.44 kU/L — AB
Maple/Box Elder IgE: <0.1 kU/L
Mouse Urine IgE: <0.1 kU/L
Oak, White IgE: 0.36 kU/L — AB
Pecan, Hickory IgE: <0.1 kU/L
Penicillium Chrysogen IgE: <0.1 kU/L
Pigweed, Rough IgE: <0.1 kU/L
Ragweed, Short IgE: <0.1 kU/L
Sheep Sorrel IgE Qn: <0.1 kU/L
Timothy Grass IgE: 2.97 kU/L — AB
White Mulberry IgE: <0.1 kU/L

## 2023-01-21 LAB — CBC WITH DIFFERENTIAL/PLATELET
Basophils Absolute: 0.1 10*3/uL (ref 0.0–0.2)
Basos: 1 %
EOS (ABSOLUTE): 0.1 10*3/uL (ref 0.0–0.4)
Eos: 1 %
Hematocrit: 39.2 % (ref 34.0–46.6)
Hemoglobin: 12.7 g/dL (ref 11.1–15.9)
Immature Grans (Abs): 0 10*3/uL (ref 0.0–0.1)
Immature Granulocytes: 0 %
Lymphocytes Absolute: 2 10*3/uL (ref 0.7–3.1)
Lymphs: 25 %
MCH: 30 pg (ref 26.6–33.0)
MCHC: 32.4 g/dL (ref 31.5–35.7)
MCV: 93 fL (ref 79–97)
Monocytes Absolute: 0.7 10*3/uL (ref 0.1–0.9)
Monocytes: 9 %
Neutrophils Absolute: 5 10*3/uL (ref 1.4–7.0)
Neutrophils: 64 %
Platelets: 275 10*3/uL (ref 150–450)
RBC: 4.24 x10E6/uL (ref 3.77–5.28)
RDW: 12.1 % (ref 11.7–15.4)
WBC: 7.8 10*3/uL (ref 3.4–10.8)

## 2023-01-21 LAB — FOOD ALLERGY PROFILE
Allergen Corn, IgE: <0.1 kU/L
Clam IgE: <0.1 kU/L
Codfish IgE: <0.1 kU/L
Egg White IgE: <0.1 kU/L
Milk IgE: 0.27 kU/L — AB
Peanut IgE: <0.1 kU/L
Scallop IgE: <0.1 kU/L
Sesame Seed IgE: <0.1 kU/L
Shrimp IgE: 0.11 kU/L — AB
Soybean IgE: <0.1 kU/L
Walnut IgE: <0.1 kU/L
Wheat IgE: 0.12 kU/L — AB

## 2023-01-21 LAB — THYROID CASCADE PROFILE: TSH: 2.93 u[IU]/mL (ref 0.450–4.500)

## 2023-01-21 LAB — CHRONIC URTICARIA: cu index: <2.3 (ref <10)

## 2023-01-21 LAB — C3 AND C4
Complement C3, Serum: 124 mg/dL (ref 82–167)
Complement C4, Serum: 20 mg/dL (ref 12–38)

## 2023-01-21 LAB — SEDIMENTATION RATE: Sed Rate: 2 mm/hr (ref 0–32)

## 2023-01-21 LAB — ANA W/REFLEX: Anti Nuclear Antibody (ANA): NEGATIVE

## 2023-01-21 LAB — C-REACTIVE PROTEIN: CRP: <1 mg/L (ref 0–10)

## 2023-01-21 LAB — TRYPTASE: Tryptase: 5.4 ug/L (ref 2.2–13.2)

## 2023-01-22 ENCOUNTER — Encounter: Payer: Self-pay | Admitting: Allergy

## 2023-02-10 ENCOUNTER — Ambulatory Visit: Payer: 59 | Admitting: Allergy

## 2023-02-18 ENCOUNTER — Ambulatory Visit (INDEPENDENT_AMBULATORY_CARE_PROVIDER_SITE_OTHER): Payer: 59 | Admitting: Allergy and Immunology

## 2023-02-18 ENCOUNTER — Encounter: Payer: Self-pay | Admitting: Allergy and Immunology

## 2023-02-18 VITALS — BP 108/60 | HR 98 | Resp 16

## 2023-02-18 DIAGNOSIS — M2550 Pain in unspecified joint: Secondary | ICD-10-CM | POA: Diagnosis not present

## 2023-02-18 DIAGNOSIS — L299 Pruritus, unspecified: Secondary | ICD-10-CM | POA: Diagnosis not present

## 2023-02-18 DIAGNOSIS — L501 Idiopathic urticaria: Secondary | ICD-10-CM

## 2023-02-18 DIAGNOSIS — R232 Flushing: Secondary | ICD-10-CM | POA: Diagnosis not present

## 2023-02-18 MED ORDER — OMALIZUMAB 150 MG/ML ~~LOC~~ SOSY
300.0000 mg | PREFILLED_SYRINGE | SUBCUTANEOUS | Status: DC
  Administered 2023-02-18 – 2023-08-05 (×7): 300 mg via SUBCUTANEOUS

## 2023-02-18 MED ORDER — EPINEPHRINE 0.3 MG/0.3ML IJ SOAJ: 0.3000 mg | INTRAMUSCULAR | 1 refills | Status: DC | PRN

## 2023-02-18 NOTE — Progress Notes (Unsigned)
Immunotherapy   Patient Details  Name: Jasmine Avila MRN: [id number] Date of Birth: 03-14-1991  02/18/2023  Clinton Quant started Xolair samples (300) today. Consent signed and patient instructions given. Prescription for epi pen was sent to pharmacy.    Guy Franco 02/18/2023, 4:38 PM

## 2023-02-18 NOTE — Progress Notes (Unsigned)
[place] - [place] - [place]   Follow-up Note  Referring Provider: [place]* Primary Provider: [place] Date of Office Visit: 02/18/2023  Subjective:   Jasmine Avila (DOB: [birth year]) is a 32 y.o. female who returns to the [facility] [place] on 02/18/2023 in re-evaluation of the following:  HPI: Jasmine Avila return to this clinic in evaluation of her urticaria.  I have never seen her in this clinic and her initial evaluation with Dr. Maudie Mercury was on 13 January 2023.  Since May 2022 she has been suffering from constant itching and burning of her skin and intermittent red patches that come and go throughout the day for which she applies ice packs which helps transiently.  She has tried antihistamines which have not helped this issue.  She has tried 9 days of a Jak inhibitor which has not helped this issue.  She was given an injection of systemic steroids which did not help this issue.  Famotidine gave her GI upset and did not help this issue.  She tried a dairy free diet for several weeks which did not help this issue.  There is not really an obvious provoking factor giving rise to this issue.  She has no associated systemic or constitutional symptoms and her skin lesions never heal with scar or hyperpigmentation.  The only issue that is somewhat unusual for her is the fact that she has intermittent achiness of her body in general using involving almost all of her joints.  Allergies as of 02/18/2023       Reactions   Sulfa Antibiotics Hives        Medication List    acetaminophen 325 MG tablet Commonly known as: TYLENOL Take by mouth.   ALLEGRA PO Take by mouth.   ondansetron 8 MG tablet Commonly known as: ZOFRAN Take 16 mg by mouth 2 (two) times daily.    Past Medical History:  Diagnosis Date   Medical history non-contributory     Past Surgical History:  Procedure Laterality Date   ADENOIDECTOMY      BLADDER SURGERY     HERNIA REPAIR     TONSILLECTOMY     TYMPANOSTOMY TUBE PLACEMENT      Review of systems negative except as noted in HPI / PMHx or noted below:  Review of Systems  Constitutional: Negative.   HENT: Negative.    Eyes: Negative.   Respiratory: Negative.    Cardiovascular: Negative.   Gastrointestinal: Negative.   Genitourinary: Negative.   Musculoskeletal: Negative.   Skin: Negative.   Neurological: Negative.   Endo/Heme/Allergies: Negative.   Psychiatric/Behavioral: Negative.       Objective:   Vitals:   02/18/23 1544  BP: 108/60  Pulse: 98  Resp: 16  SpO2: 99%          Physical Exam Constitutional:      Appearance: She is not diaphoretic.  HENT:     Head: Normocephalic.     Right Ear: Tympanic membrane, ear canal and external ear normal.     Left Ear: Tympanic membrane, ear canal and external ear normal.     Nose: Nose normal. No mucosal edema or rhinorrhea.     Mouth/Throat:     Pharynx: Uvula midline. No oropharyngeal exudate.  Eyes:     Conjunctiva/sclera: Conjunctivae normal.  Neck:     Thyroid: No thyromegaly.     Trachea: Trachea normal. No tracheal tenderness or tracheal deviation.  Cardiovascular:  Rate and Rhythm: Normal rate and regular rhythm.     Heart sounds: Normal heart sounds, S1 normal and S2 normal. No murmur heard. Pulmonary:     Effort: No respiratory distress.     Breath sounds: Normal breath sounds. No stridor. No wheezing or rales.  Lymphadenopathy:     Head:     Right side of head: No tonsillar adenopathy.     Left side of head: No tonsillar adenopathy.     Cervical: No cervical adenopathy.  Skin:    Findings: Rash (Red blanching patch dorsal right foot) present. No erythema.     Nails: There is no clubbing.  Neurological:     Mental Status: She is alert.     Diagnostics:   Results of blood tests obtained 13 January 2023 identifies creatinine 0.61 Mg/DL, AST 16 U/L, ALT 16 U/L, WBC 7.8, absolute  eosinophil 100, absolute lymphocyte 2000, hemoglobin 12.7, platelet 275, TSH 2.930 IU/mL, IgE antibodies directed against dust mite, [place] grass, [place] grass, birch, oak, milk IgE 0.27 KU/L, shrimp 0.11 KU/L, wheat 0.12 KU/L  Assessment and Plan:   1. Idiopathic urticaria   2. Pruritic disorder   3. Flushing   4. Arthralgia of multiple sites, bilateral    1. Cetirizine 10 mg - 1-2 tablets 1-2 times per day (MAX=7m/day)  2. Omalizumab 300 mg delivered in clinic today and every 4 weeks  3. Blood - tryptase, SED, CRP, RF, CCP, ANA w/R, thyroid peroxidase antibody  4. 24 hour urine - 5-HIAA, methyl-histamine  5. Further evaluation and treatment???  6. Return to clinic in 8 weeks (on 3rd omalizumab injection)  It is not entirely clear what is going on with Jasmine Avila

## 2023-02-18 NOTE — Patient Instructions (Addendum)
  1. Cetirizine 10 mg - 1-2 tablets 1-2 times per day (MAX=53m/day)  2. Omalizumab 300 mg delivered in clinic today and every 4 weeks  3. Blood - tryptase, SED, CRP, RF, CCP, ANA w/R, thyroid peroxidase antibody  4. 24 hour urine - 5-HIAA, methyl-histamine  5. Further evaluation and treatment???  6. Return to clinic in 8 weeks (on 3rd omalizumab injection)

## 2023-02-19 ENCOUNTER — Encounter: Payer: Self-pay | Admitting: Allergy and Immunology

## 2023-02-19 LAB — THYROID PEROXIDASE ANTIBODY: Thyroperoxidase Ab SerPl-aCnc: 138 IU/mL — ABNORMAL HIGH (ref 0–34)

## 2023-02-20 ENCOUNTER — Telehealth: Payer: Self-pay | Admitting: *Deleted

## 2023-02-20 LAB — TRYPTASE: Tryptase: 4.1 ug/L (ref 2.2–13.2)

## 2023-02-20 LAB — SEDIMENTATION RATE: Sed Rate: 2 mm/h (ref 0–32)

## 2023-02-20 LAB — ANA W/REFLEX: Anti Nuclear Antibody (ANA): NEGATIVE

## 2023-02-20 LAB — C-REACTIVE PROTEIN: CRP: <1 mg/L (ref 0–10)

## 2023-02-20 LAB — RHEUMATOID FACTOR: Rheumatoid fact SerPl-aCnc: <10 IU/mL (ref <14.0)

## 2023-02-20 LAB — CYCLIC CITRUL PEPTIDE ANTIBODY, IGG/IGA: Cyclic Citrullin Peptide Ab: 7 U (ref 0–19)

## 2023-02-20 NOTE — Telephone Encounter (Signed)
Called patient and advised approval, copay card and submit to [employer] and will reach out once delivery set to make appt for next injection

## 2023-02-20 NOTE — Telephone Encounter (Signed)
-----   Message from Guy Franco, [place] sent at 02/18/2023  5:06 PM EST ----- Regarding: Xolair new start Dr. Neldon Mc started patient on Xolair 300 mg today. Consent was signed and epi pen sent to pharmacy. Please help. Thank you.

## 2023-02-27 LAB — 5 HIAA, QUANTITATIVE, URINE, 24 HOUR
5-HIAA, Ur: 1.6 mg/L
5-HIAA,Quant.,24 Hr Urine: 2.4 mg/24 hr (ref 0.0–14.9)

## 2023-03-04 LAB — N-METHYLHISTAMINE, 24 HR, U
Collection Duration: 24 h
Creatinine Concent. 24 Hr, U: 51 mg/dL
Creatinine, 24 Hour, U: 765 mg/24 h (ref 603–1783)
N-Methylhistamine, 24 Hr, U: 275 mcg/g Cr — ABNORMAL HIGH (ref 30–200)
Urine Volume: 1500 mL

## 2023-03-05 ENCOUNTER — Encounter: Payer: Self-pay | Admitting: Allergy and Immunology

## 2023-03-18 ENCOUNTER — Ambulatory Visit (INDEPENDENT_AMBULATORY_CARE_PROVIDER_SITE_OTHER): Payer: 59

## 2023-03-18 DIAGNOSIS — L501 Idiopathic urticaria: Secondary | ICD-10-CM | POA: Diagnosis not present

## 2023-04-15 ENCOUNTER — Encounter: Payer: Self-pay | Admitting: Allergy and Immunology

## 2023-04-15 ENCOUNTER — Ambulatory Visit: Payer: 59

## 2023-04-15 ENCOUNTER — Ambulatory Visit (INDEPENDENT_AMBULATORY_CARE_PROVIDER_SITE_OTHER): Payer: 59 | Admitting: Allergy and Immunology

## 2023-04-15 VITALS — BP 100/62 | HR 84 | Resp 16

## 2023-04-15 DIAGNOSIS — L501 Idiopathic urticaria: Secondary | ICD-10-CM | POA: Diagnosis not present

## 2023-04-15 DIAGNOSIS — M2550 Pain in unspecified joint: Secondary | ICD-10-CM

## 2023-04-15 DIAGNOSIS — E063 Autoimmune thyroiditis: Secondary | ICD-10-CM | POA: Diagnosis not present

## 2023-04-15 NOTE — Patient Instructions (Addendum)
  1. Fexofenadine 180 mg  - 1 tablets 1-2-3 times per day    2. Omalizumab 300 mg delivered every 4 weeks  3. Will need repeat TSH, FT4, thyroid peroxidase ab summer 2024  6. Return to clinic summer 2024

## 2023-04-15 NOTE — Progress Notes (Unsigned)
[place] - [place] - [place] - [place] - [place]   Follow-up Note  Referring Provider: [facility]* Primary Provider: [facility] Date of Office Visit: 04/15/2023  Subjective:   Jasmine Avila (DOB: 1991-02-23) is a 32 y.o. female who [facility] on 04[facility]15/2023 in re-evaluation of the following:  HPI: Alonie returns to this clinic in evaluation of hives and arthralgia.  I last saw her in this clinic 18 February 2023.  We started her on omalizumab during her last visit.  She has had 2 injections.  She cannot really tell much difference at this point in time.  She still really itchy and has blotchy hives and some flushing.  She still has some ill-defined achiness of her joints.  She has been using Allegra at around 180 mg 1 or 2 or 3 times per day.  She is relatively intolerant of using cetirizine secondary to sedation.  Allergies as of 04/15/2023       Reactions   Sulfa Antibiotics Hives        Medication List    acetaminophen 325 MG tablet Commonly known as: TYLENOL Take by mouth.   ALLEGRA PO Take by mouth.   EPINEPHrine 0.3 mg/0.3 mL Soaj injection Commonly known as: EpiPen 2-Pak Inject 0.3 mg into the muscle as needed.   ondansetron 8 MG tablet Commonly known as: ZOFRAN Take 16 mg by mouth 2 (two) times daily.    Past Medical History:  Diagnosis Date   Medical history non-contributory     Past Surgical History:  Procedure Laterality Date   ADENOIDECTOMY     BLADDER SURGERY     HERNIA REPAIR     TONSILLECTOMY     TYMPANOSTOMY TUBE PLACEMENT      Review of systems negative except as noted in HPI / PMHx or noted below:  Review of Systems  Constitutional: Negative.   HENT: Negative.    Eyes: Negative.   Respiratory: Negative.    Cardiovascular: Negative.   Gastrointestinal: Negative.   Genitourinary: Negative.   Musculoskeletal: Negative.   Skin: Negative.   Neurological:  Negative.   Endo/Heme/Allergies: Negative.   Psychiatric/Behavioral: Negative.       Objective:   Vitals:   04/15/23 1554  BP: 100/62  Pulse: 84  Resp: 16  SpO2: 99%          Physical Exam Constitutional:      Appearance: She is not diaphoretic.  HENT:     Head: Normocephalic.     Right Ear: Tympanic membrane, ear canal and external ear normal.     Left Ear: Tympanic membrane, ear canal and external ear normal.     Nose: Nose normal. No mucosal edema or rhinorrhea.     Mouth/Throat:     Pharynx: Uvula midline. No oropharyngeal exudate.  Eyes:     Conjunctiva/sclera: Conjunctivae normal.  Neck:     Thyroid: No thyromegaly.     Trachea: Trachea normal. No tracheal tenderness or tracheal deviation.  Cardiovascular:     Rate and Rhythm: Normal rate and regular rhythm.     Heart sounds: Normal heart sounds, S1 normal and S2 normal. No murmur heard. Pulmonary:     Effort: No respiratory distress.     Breath sounds: Normal breath sounds. No stridor. No wheezing or rales.  Lymphadenopathy:     Head:     Right side of head: No tonsillar adenopathy.     Left side of head: No  tonsillar adenopathy.     Cervical: No cervical adenopathy.  Skin:    Findings: No erythema or rash.     Nails: There is no clubbing.  Neurological:     Mental Status: She is alert.     Diagnostics: Results of blood tests obtained 18 February 2023 identifies tryptase 4.1 UG/L, sed rate 2, CRP less than 1 Mg/L, rheumatoid factor less than 10 U/mL, CCP less than 20 U/mL, ANA negative, thyroid peroxidase antibody 138 EU/mL,   Results of 24-hour urine identifies N-methylhistamine to 75 mcg/G creatinine 5-HIAA 2.4 mg/24-hour  Assessment and Plan:   1. Idiopathic urticaria   2. Arthralgia of multiple sites, bilateral   3. Hashimoto's thyroiditis    1. Fexofenadine 180 mg  - 1 tablets 1-2-3 times per day    2. Omalizumab 300 mg delivered every 4 weeks  3. Will need repeat TSH, FT4, thyroid  peroxidase ab summer 2024  6. Return to clinic summer 2024  Melea has a very good understanding of her disease state and how her medications work and hopefully she is going to be receiving a significant benefit from the use of omalizumab as she moves forward with this plan.  She certainly has an overactive immune system with a component of Hashimoto's thyroiditis and some diffuse arthralgia.  Will need to recheck her thyroid function as noted above when she returns to this clinic in the summer 2024.  Laurette Schimke, MD Allergy / Immunology  [facility]

## 2023-04-16 ENCOUNTER — Encounter: Payer: Self-pay | Admitting: Allergy and Immunology

## 2023-05-13 ENCOUNTER — Ambulatory Visit (INDEPENDENT_AMBULATORY_CARE_PROVIDER_SITE_OTHER): Payer: 59

## 2023-05-13 DIAGNOSIS — L501 Idiopathic urticaria: Secondary | ICD-10-CM

## 2023-06-10 ENCOUNTER — Ambulatory Visit (INDEPENDENT_AMBULATORY_CARE_PROVIDER_SITE_OTHER): Payer: 59 | Admitting: *Deleted

## 2023-06-10 DIAGNOSIS — L501 Idiopathic urticaria: Secondary | ICD-10-CM

## 2023-07-08 ENCOUNTER — Ambulatory Visit: Payer: 59

## 2023-07-08 ENCOUNTER — Encounter: Payer: Self-pay | Admitting: Allergy and Immunology

## 2023-07-08 ENCOUNTER — Ambulatory Visit (INDEPENDENT_AMBULATORY_CARE_PROVIDER_SITE_OTHER): Payer: 59 | Admitting: Allergy and Immunology

## 2023-07-08 VITALS — BP 108/66 | HR 84 | Resp 16 | Ht 66.0 in | Wt 128.0 lb

## 2023-07-08 DIAGNOSIS — L501 Idiopathic urticaria: Secondary | ICD-10-CM

## 2023-07-08 DIAGNOSIS — E063 Autoimmune thyroiditis: Secondary | ICD-10-CM | POA: Diagnosis not present

## 2023-07-08 DIAGNOSIS — L719 Rosacea, unspecified: Secondary | ICD-10-CM

## 2023-07-08 MED ORDER — METRONIDAZOLE 0.75 % EX CREA: TOPICAL_CREAM | CUTANEOUS | 5 refills | Status: DC

## 2023-07-08 MED ORDER — DOXYCYCLINE HYCLATE 50 MG PO CAPS: 50.0000 mg | ORAL_CAPSULE | Freq: Every day | ORAL | 5 refills | Status: DC

## 2023-07-08 NOTE — Progress Notes (Unsigned)
[place] - [place] - [place] - [place] - [place]   Follow-up Note  Referring Provider: [facility] Primary Provider: [facility] Date of Office Visit: 07/08/2023  Subjective:   Jasmine Avila (DOB: 06-21-1991) is a 32 y.o. female who [facility] in re-evaluation of the following:  HPI: Jasmine Avila returns to this clinic in evaluation of chronic urticaria and flushing and Hashimoto's thyroiditis.  I last saw her in this clinic 15 April 2023.  She has had approximately 5 injections of omalizumab and although she does not have any hives she still feels as though her skin is flushing and occasionally burning.  She still continues to have flushing of her face especially if she progresses through her menstrual cycle.  She is using fexofenadine twice a day.  Allergies as of 07/08/2023       Reactions   Sulfa Antibiotics Hives        Medication List    ALLEGRA PO Take by mouth.   EPINEPHrine 0.3 mg/0.3 mL Soaj injection Commonly known as: EpiPen 2-Pak Inject 0.3 mg into the muscle as needed.   Xolair 150 MG/ML prefilled syringe Generic drug: omalizumab Inject into the skin.    Past Medical History:  Diagnosis Date   Medical history non-contributory    Urticaria     Past Surgical History:  Procedure Laterality Date   ADENOIDECTOMY     BLADDER SURGERY     HERNIA REPAIR     TONSILLECTOMY     TYMPANOSTOMY TUBE PLACEMENT      Review of systems negative except as noted in HPI / PMHx or noted below:  Review of Systems  Constitutional: Negative.   HENT: Negative.    Eyes: Negative.   Respiratory: Negative.    Cardiovascular: Negative.   Gastrointestinal: Negative.   Genitourinary: Negative.   Musculoskeletal: Negative.   Skin: Negative.   Neurological: Negative.   Endo/Heme/Allergies: Negative.   Psychiatric/Behavioral: Negative.       Objective:   Vitals:   07/08/23 1519   BP: 108/66  Pulse: 84  Resp: 16  SpO2: 98%   Height: 5\' 6"  (167.6 cm)  Weight: 128 lb (58.1 kg)   Physical Exam Skin:    Findings: Rash (facial erythema, cystic lesions, papules) present.     Diagnostics: none  Assessment and Plan:   1. Idiopathic urticaria   2. Hashimoto's thyroiditis   3. Rosacea    1. Fexofenadine 180 mg  - 1 tablets 1-2 times per day    2. Omalizumab 300 mg delivered every 4 weeks  3. Repeat TSH, FT4, thyroid peroxidase ab   4. Treat rosacea:   A. Doxycycline 50 mg - 1 tablet 1 time per day  B. Metrocream - apply to face 1-2 times per day  5. Return to clinic 6 months or earlier if problem  6. Plan for fall flu vaccine  Jasmine Avila will continue on omalizumab and fexofenadine which has resulted in resolution of her hives but she still has a sensation that she is flushing on her skin even in the face of using omalizumab and fexofenadine.  She definitely has something going on with her facial skin and this may actually be rosacea and we will treat her with a combination of therapy directed against rosacea as noted above.  It is time to check her Hashimoto's thyroiditis activity and we will repeat her thyroid function test and thyroid peroxidase antibody.  Jasmine Schimke, MD Allergy /  Immunology [facility]

## 2023-07-08 NOTE — Patient Instructions (Signed)
  1. Fexofenadine 180 mg  - 1 tablets 1-2 times per day    2. Omalizumab 300 mg delivered every 4 weeks  3. Repeat TSH, FT4, thyroid peroxidase ab   4. Treat rosacea:   A. Doxycycline 50 mg - 1 tablet 1 time per day  B. Metrocream - apply to face 1-2 times per day  5. Return to clinic 6 months or earlier if problem  6. Plan for fall flu vaccine

## 2023-07-09 ENCOUNTER — Encounter: Payer: Self-pay | Admitting: Allergy and Immunology

## 2023-07-09 LAB — TSH+FREE T4
Free T4: 1.26 ng/dL (ref 0.82–1.77)
TSH: 2.86 u[IU]/mL (ref 0.450–4.500)

## 2023-07-09 LAB — THYROID PEROXIDASE ANTIBODY: Thyroperoxidase Ab SerPl-aCnc: 174 IU/mL — ABNORMAL HIGH (ref 0–34)

## 2023-08-05 ENCOUNTER — Ambulatory Visit (INDEPENDENT_AMBULATORY_CARE_PROVIDER_SITE_OTHER): Payer: 59 | Admitting: *Deleted

## 2023-08-05 DIAGNOSIS — L501 Idiopathic urticaria: Secondary | ICD-10-CM | POA: Diagnosis not present

## 2023-08-31 ENCOUNTER — Encounter (HOSPITAL_COMMUNITY): Payer: Self-pay | Admitting: Obstetrics and Gynecology

## 2023-08-31 ENCOUNTER — Inpatient Hospital Stay (HOSPITAL_COMMUNITY)
Admission: AD | Admit: 2023-08-31 | Discharge: 2023-08-31 | Disposition: A | Discharge status: Home / Self Care | Payer: 59 | Attending: Obstetrics and Gynecology | Admitting: Obstetrics and Gynecology

## 2023-08-31 ENCOUNTER — Inpatient Hospital Stay (HOSPITAL_COMMUNITY): Payer: 59

## 2023-08-31 DIAGNOSIS — O3680X0 Pregnancy with inconclusive fetal viability, not applicable or unspecified: Secondary | ICD-10-CM | POA: Insufficient documentation

## 2023-08-31 DIAGNOSIS — Z3201 Encounter for pregnancy test, result positive: Secondary | ICD-10-CM | POA: Diagnosis present

## 2023-08-31 DIAGNOSIS — Z3A01 Less than 8 weeks gestation of pregnancy: Secondary | ICD-10-CM | POA: Insufficient documentation

## 2023-08-31 DIAGNOSIS — R1030 Lower abdominal pain, unspecified: Secondary | ICD-10-CM | POA: Insufficient documentation

## 2023-08-31 DIAGNOSIS — R252 Cramp and spasm: Secondary | ICD-10-CM | POA: Diagnosis present

## 2023-08-31 HISTORY — DX: Autoimmune thyroiditis: E06.3

## 2023-08-31 HISTORY — DX: Urinary tract infection, site not specified: N39.0

## 2023-08-31 LAB — URINALYSIS, ROUTINE W REFLEX MICROSCOPIC
Bacteria, UA: NONE SEEN
Bilirubin Urine: NEGATIVE
Glucose, UA: NEGATIVE mg/dL
Hgb urine dipstick: NEGATIVE
Ketones, ur: NEGATIVE mg/dL
Nitrite: NEGATIVE
Protein, ur: NEGATIVE mg/dL
Specific Gravity, Urine: 1.008 (ref 1.005–1.030)
pH: 8 (ref 5.0–8.0)

## 2023-08-31 LAB — POCT PREGNANCY, URINE: Preg Test, Ur: POSITIVE — AB

## 2023-08-31 LAB — HCG, QUANTITATIVE, PREGNANCY: hCG, Beta Chain, Quant, S: 1673 m[IU]/mL — ABNORMAL HIGH (ref <5)

## 2023-08-31 NOTE — MAU Provider Note (Signed)
Chief Complaint: Abdominal Pain and Possible Pregnancy   Event Date/Time   First Provider Initiated Contact with Patient 08/31/23 1647      SUBJECTIVE HPI: Jasmine Avila is a 32 y.o. [id number] at [redacted]w[redacted]d by LMP who presents to maternity admissions reporting lower abdominal cramping and positive pregnancy test. She had BTL reversal 10/2022.  There is no bleeding.  She has a child with spina bifida and plans  prenatal care for this pregnancy at [facility].     HPI  Past Medical History:  Diagnosis Date   Hashimoto's disease    Urticaria    UTI (urinary tract infection)    Past Surgical History:  Procedure Laterality Date   ADENOIDECTOMY     BLADDER SURGERY     CESAREAN SECTION     HERNIA REPAIR     OTHER SURGICAL HISTORY  11/2022   tubal reversal, laparotomy   TONSILLECTOMY     TUBAL LIGATION     TYMPANOSTOMY TUBE PLACEMENT     Social History   Socioeconomic History   Marital status: Single    Spouse name: Not on file   Number of children: Not on file   Years of education: Not on file   Highest education level: Not on file  Occupational History   Not on file  Tobacco Use   Smoking status: Never   Smokeless tobacco: Never  Substance and Sexual Activity   Alcohol use: No   Drug use: No   Sexual activity: Yes  Other Topics Concern   Not on file  Social History Narrative   Not on file   Social Determinants of Health   Financial Resource Strain: Not on file  Food Insecurity: Not on file  Transportation Needs: Not on file  Physical Activity: Not on file  Stress: Not on file  Social Connections: Not on file  Intimate Partner Violence: Not on file   No current facility-administered medications on file prior to encounter.   Current Outpatient Medications on File Prior to Encounter  Medication Sig Dispense Refill   Fexofenadine HCl (ALLEGRA PO) Take by mouth.     XOLAIR 150 MG/ML prefilled syringe Inject into the skin.     doxycycline (VIBRAMYCIN) 50 MG capsule Take 1  capsule (50 mg total) by mouth daily. 30 capsule 5   EPINEPHrine (EPIPEN 2-PAK) 0.3 mg/0.3 mL IJ SOAJ injection Inject 0.3 mg into the muscle as needed. 2 each 1   metroNIDAZOLE (METROCREAM) 0.75 % cream Apply to facial skin one to two times daily as directed. 45 g 5   Allergies  Allergen Reactions   Sulfa Antibiotics Hives    ROS:  Review of Systems  Constitutional:  Negative for chills, fatigue and fever.  Respiratory:  Negative for shortness of breath.   Cardiovascular:  Negative for chest pain.  Gastrointestinal:  Positive for abdominal pain. Negative for nausea and vomiting.  Genitourinary:  Negative for difficulty urinating, dysuria, flank pain, pelvic pain, vaginal bleeding, vaginal discharge and vaginal pain.  Neurological:  Negative for dizziness and headaches.  Psychiatric/Behavioral: Negative.       I have reviewed patient's Past Medical Hx, Surgical Hx, Family Hx, Social Hx, medications and allergies.   Physical Exam  Patient Vitals for the past 24 hrs:  BP Temp Temp src Pulse Resp SpO2 Height Weight  08/31/23 1525 111/69 98.1 F (36.7 C) Oral 88 16 99 % 5\' 7"  (1.702 m) 58.7 kg   Constitutional: Well-developed, well-nourished female in no acute distress.  Cardiovascular: normal  rate Respiratory: normal effort GI: Abd soft, non-tender. Pos BS x 4 MS: Extremities nontender, no edema, normal ROM Neurologic: Alert and oriented x 4.  GU: Neg CVAT.  PELVIC EXAM: Deferred   LAB RESULTS Results for orders placed or performed during the hospital encounter of 08/31/23 (from the past 24 hour(s))  Pregnancy, urine POC     Status: Abnormal   Collection Time: 08/31/23  3:38 PM  Result Value Ref Range   Preg Test, Ur POSITIVE (A) NEGATIVE  Urinalysis, Routine w reflex microscopic -Urine, Clean Catch     Status: Abnormal   Collection Time: 08/31/23  4:22 PM  Result Value Ref Range   Color, Urine STRAW (A) YELLOW   APPearance CLEAR CLEAR   Specific Gravity, Urine 1.008  1.005 - 1.030   pH 8.0 5.0 - 8.0   Glucose, UA NEGATIVE NEGATIVE mg/dL   Hgb urine dipstick NEGATIVE NEGATIVE   Bilirubin Urine NEGATIVE NEGATIVE   Ketones, ur NEGATIVE NEGATIVE mg/dL   Protein, ur NEGATIVE NEGATIVE mg/dL   Nitrite NEGATIVE NEGATIVE   Leukocytes,Ua MODERATE (A) NEGATIVE   RBC / HPF 0-5 0 - 5 RBC/hpf   WBC, UA 0-5 0 - 5 WBC/hpf   Bacteria, UA NONE SEEN NONE SEEN   Squamous Epithelial / HPF 0-5 0 - 5 /HPF  hCG, quantitative, pregnancy     Status: Abnormal   Collection Time: 08/31/23  4:44 PM  Result Value Ref Range   hCG, Beta Chain, Quant, S 1,673 (H) <5 mIU/mL       IMAGING US OB LESS THAN 14 WEEKS WITH OB TRANSVAGINAL  Result Date: 08/31/2023 CLINICAL DATA:  Vaginal bleeding and cramping in 1st trimester pregnancy. EXAM: OBSTETRIC <14 WK [place] AND TRANSVAGINAL OB US TECHNIQUE: Both transabdominal and transvaginal ultrasound examinations were performed for complete evaluation of the gestation as well as the maternal uterus, adnexal regions, and pelvic cul-de-sac. Transvaginal technique was performed to assess early pregnancy. COMPARISON:  None Available. FINDINGS: Intrauterine gestational sac: None Maternal uterus/adnexae: Endometrial thickness measures 9 mm. Trace amount of fluid seen in endometrial cavity. No fibroids identified. Both ovaries are normal in appearance. No adnexal mass or abnormal free fluid identified. IMPRESSION: Pregnancy of unknown anatomic location (no intrauterine gestational sac or adnexal mass identified). Differential diagnosis includes recent spontaneous abortion, IUP too early to visualize, and non-visualized ectopic pregnancy. Recommend followup of beta-hCG levels, and follow up US as warranted clinically. Electronically Signed   By: Danae Orleans M.D.   On: 08/31/2023 17:58    MAU Management/MDM: Orders Placed This Encounter  Procedures   Culture, OB Urine   US OB LESS THAN 14 WEEKS WITH OB TRANSVAGINAL   hCG, quantitative, pregnancy    Urinalysis, Routine w reflex microscopic -Urine, Clean Catch   Pregnancy, urine POC   Discharge patient    No orders of the defined types were placed in this encounter.   Findings today could represent a normal early pregnancy, spontaneous abortion or ectopic pregnancy which can be life-threatening.  Ectopic precautions were given to the patient with plan to follow up in 48 hours for repeat quant hcg to evaluate pregnancy development.  Stat hcg at [facility] on 09/02/23.     ASSESSMENT 1. Pregnancy of unknown anatomic location   2. [redacted] weeks gestation of pregnancy     PLAN Discharge home Allergies as of 08/31/2023       Reactions   Sulfa Antibiotics Hives        Medication List  STOP taking these medications    doxycycline 50 MG capsule Commonly known as: VIBRAMYCIN   metroNIDAZOLE 0.75 % cream Commonly known as: METROCREAM   Xolair 150 MG/ML prefilled syringe Generic drug: omalizumab       TAKE these medications    ALLEGRA PO Take by mouth.   EPINEPHrine 0.3 mg/0.3 mL Soaj injection Commonly known as: EpiPen 2-Pak Inject 0.3 mg into the muscle as needed.        Follow-up Information     [facility] Follow up.   Specialty: Obstetrics and Gynecology Why: As needed for emergencies Contact information: [address] [place] [address] [phone]                Sharen Counter Certified Nurse-Midwife 08/31/2023  7:40 PM

## 2023-08-31 NOTE — MAU Note (Signed)
Jasmine Avila is a 32 y.o. at Unknown here in MAU reporting: has been pretty crampy for the past several days, thought her cycle was going to start.  Had tubal reversal in Dec. No bleeding.  +HPT. LMP: 8/5 Onset of complaint: about a wk Pain score: mild Vitals:   08/31/23 1525  BP: 111/69  Pulse: 88  Resp: 16  Temp: 98.1 F (36.7 C)  SpO2: 99%      Lab orders placed from triage:  upt

## 2023-09-01 LAB — CULTURE, OB URINE: Culture: <10000 — AB

## 2023-09-02 ENCOUNTER — Other Ambulatory Visit: Payer: Self-pay

## 2023-09-02 ENCOUNTER — Ambulatory Visit: Payer: 59

## 2023-09-06 ENCOUNTER — Inpatient Hospital Stay (HOSPITAL_COMMUNITY): Payer: 59

## 2023-09-06 ENCOUNTER — Other Ambulatory Visit: Payer: Self-pay

## 2023-09-06 ENCOUNTER — Inpatient Hospital Stay (HOSPITAL_COMMUNITY)
Admission: AD | Admit: 2023-09-06 | Discharge: 2023-09-06 | Disposition: A | Discharge status: Home / Self Care | Payer: 59 | Attending: Obstetrics & Gynecology | Admitting: Obstetrics & Gynecology

## 2023-09-06 DIAGNOSIS — O418X90 Other specified disorders of amniotic fluid and membranes, unspecified trimester, not applicable or unspecified: Secondary | ICD-10-CM | POA: Diagnosis not present

## 2023-09-06 DIAGNOSIS — Z3A01 Less than 8 weeks gestation of pregnancy: Secondary | ICD-10-CM | POA: Diagnosis not present

## 2023-09-06 DIAGNOSIS — O468X9 Other antepartum hemorrhage, unspecified trimester: Secondary | ICD-10-CM | POA: Diagnosis not present

## 2023-09-06 DIAGNOSIS — O26851 Spotting complicating pregnancy, first trimester: Secondary | ICD-10-CM | POA: Diagnosis not present

## 2023-09-06 DIAGNOSIS — O208 Other hemorrhage in early pregnancy: Secondary | ICD-10-CM | POA: Insufficient documentation

## 2023-09-06 DIAGNOSIS — O209 Hemorrhage in early pregnancy, unspecified: Secondary | ICD-10-CM | POA: Diagnosis present

## 2023-09-06 DIAGNOSIS — N939 Abnormal uterine and vaginal bleeding, unspecified: Secondary | ICD-10-CM | POA: Diagnosis not present

## 2023-09-06 LAB — URINALYSIS, ROUTINE W REFLEX MICROSCOPIC
Bilirubin Urine: NEGATIVE
Glucose, UA: 50 mg/dL — AB
Ketones, ur: 20 mg/dL — AB
Leukocytes,Ua: NEGATIVE
Nitrite: NEGATIVE
Protein, ur: NEGATIVE mg/dL
RBC / HPF: >50 RBC/hpf (ref 0–5)
Specific Gravity, Urine: 1.015 (ref 1.005–1.030)
pH: 5 (ref 5.0–8.0)

## 2023-09-06 LAB — CBC
HCT: 42.6 % (ref 36.0–46.0)
Hemoglobin: 14.3 g/dL (ref 12.0–15.0)
MCH: 30 pg (ref 26.0–34.0)
MCHC: 33.6 g/dL (ref 30.0–36.0)
MCV: 89.5 fL (ref 80.0–100.0)
Platelets: 286 10*3/uL (ref 150–400)
RBC: 4.76 MIL/uL (ref 3.87–5.11)
RDW: 12.2 % (ref 11.5–15.5)
WBC: 9.7 10*3/uL (ref 4.0–10.5)
nRBC: 0 % (ref 0.0–0.2)

## 2023-09-06 LAB — ABO/RH: ABO/RH(D): A POS

## 2023-09-06 LAB — HCG, QUANTITATIVE, PREGNANCY: hCG, Beta Chain, Quant, S: 19404 m[IU]/mL — ABNORMAL HIGH (ref <5)

## 2023-09-06 LAB — WET PREP, GENITAL
Clue Cells Wet Prep HPF POC: NONE SEEN
Sperm: NONE SEEN
Trich, Wet Prep: NONE SEEN
WBC, Wet Prep HPF POC: >=10 — AB (ref <10)
Yeast Wet Prep HPF POC: NONE SEEN

## 2023-09-06 NOTE — MAU Provider Note (Signed)
History     CSN: [id number]  Arrival date and time: 09/06/23 1724   None     Chief Complaint  Patient presents with   Vaginal Bleeding   Jasmine Avila , a  32 y.o. [phone] at [redacted]w[redacted]d presents to MAU with complaints of bright red vaginal bleeding. Patient states that she was recently diagnosed with a Subchorionic hematoma and was told to report to MAU if the bleeding became bright red. She states that earlier today around 2pm she started having bright red bleeding when she wiped. She denies passing large clots but notes some "specs" in the toilet when she pees. She also reports some intermittent abdominal cramping that comes and goes. She denies having to take anything for pain. She denies recent intercourse and abnormal vaginal discharge or urinary symptoms.          OB History     Gravida  3   Para  2   Term  1   Preterm  1   AB      Living  2      SAB      IAB      Ectopic      Multiple      Live Births  2        Obstetric Comments  Baby # 2 has spina bifida, had surgery inutero , pt had PTL following surgery,          Past Medical History:  Diagnosis Date   Hashimoto's disease    Urticaria    UTI (urinary tract infection)     Past Surgical History:  Procedure Laterality Date   ADENOIDECTOMY     BLADDER SURGERY     CESAREAN SECTION     HERNIA REPAIR     OTHER SURGICAL HISTORY  11/2022   tubal reversal, laparotomy   TONSILLECTOMY     TUBAL LIGATION     TYMPANOSTOMY TUBE PLACEMENT      Family History  Problem Relation Age of Onset   Urticaria Mother    Healthy Father    Allergic rhinitis Sister    Asthma Brother     Social History   Tobacco Use   Smoking status: Never   Smokeless tobacco: Never  Substance Use Topics   Alcohol use: No   Drug use: No    Allergies:  Allergies  Allergen Reactions   Sulfa Antibiotics Hives    Medications Prior to Admission  Medication Sig Dispense Refill Last Dose   EPINEPHrine (EPIPEN  2-PAK) 0.3 mg/0.3 mL IJ SOAJ injection Inject 0.3 mg into the muscle as needed. 2 each 1    Fexofenadine HCl (ALLEGRA PO) Take by mouth.       Review of Systems  Constitutional:  Negative for chills, fatigue and fever.  Eyes:  Negative for pain and visual disturbance.  Respiratory:  Negative for apnea, shortness of breath and wheezing.   Cardiovascular:  Negative for chest pain and palpitations.  Gastrointestinal:  Negative for abdominal pain, constipation, diarrhea, nausea and vomiting.  Genitourinary:  Positive for pelvic pain and vaginal bleeding. Negative for difficulty urinating, dysuria, vaginal discharge and vaginal pain.  Musculoskeletal:  Negative for back pain.  Neurological:  Negative for seizures, weakness and headaches.  Psychiatric/Behavioral:  Negative for suicidal ideas.    Physical Exam   Blood pressure 112/68, pulse (!) 105, temperature 98 F (36.7 C), temperature source Oral, resp. rate 18, height 5\' 7"  (1.702 m), weight 58.3 kg, last menstrual period 08/03/2023,  SpO2 100%, unknown if currently breastfeeding.  Physical Exam Vitals and nursing note reviewed.  Constitutional:      General: She is not in acute distress.    Appearance: Normal appearance.  HENT:     Head: Normocephalic.  Pulmonary:     Effort: Pulmonary effort is normal.  Musculoskeletal:     Cervical back: Normal range of motion.  Skin:    General: Skin is warm and dry.  Neurological:     Mental Status: She is alert and oriented to person, place, and time.  Psychiatric:        Mood and Affect: Mood normal.     MAU Course  Procedures Orders Placed This Encounter  Procedures   Wet prep, genital   US OB Transvaginal   CBC   hCG, quantitative, pregnancy   Urinalysis, Routine w reflex microscopic -Urine, Clean Catch   Diet NPO time specified   ABO/Rh   Discharge patient   Results for orders placed or performed during the hospital encounter of 09/06/23 (from the past 24 hour(s))  ABO/Rh      Status: None   Collection Time: 09/06/23  7:24 PM  Result Value Ref Range   ABO/RH(D) A POS    No rh immune globuloin      NOT A RH IMMUNE GLOBULIN CANDIDATE, PT RH POSITIVE Performed at [facility], [address], [place], [place] [address]   CBC     Status: None   Collection Time: 09/06/23  7:26 PM  Result Value Ref Range   WBC 9.7 4.0 - 10.5 K/uL   RBC 4.76 3.87 - 5.11 MIL/uL   Hemoglobin 14.3 12.0 - 15.0 g/dL   HCT 86.5 78.4 - 69.6 %   MCV 89.5 80.0 - 100.0 fL   MCH 30.0 26.0 - 34.0 pg   MCHC 33.6 30.0 - 36.0 g/dL   RDW 29.5 28.4 - 13.2 %   Platelets 286 150 - 400 K/uL   nRBC 0.0 0.0 - 0.2 %  hCG, quantitative, pregnancy     Status: Abnormal   Collection Time: 09/06/23  7:26 PM  Result Value Ref Range   hCG, Beta Chain, Quant, S 19,404 (H) <5 mIU/mL  Wet prep, genital     Status: Abnormal   Collection Time: 09/06/23  8:08 PM  Result Value Ref Range   Yeast Wet Prep HPF POC NONE SEEN NONE SEEN   Trich, Wet Prep NONE SEEN NONE SEEN   Clue Cells Wet Prep HPF POC NONE SEEN NONE SEEN   WBC, Wet Prep HPF POC >=10 (A) <10   Sperm NONE SEEN   Urinalysis, Routine w reflex microscopic -Urine, Clean Catch     Status: Abnormal   Collection Time: 09/06/23  8:30 PM  Result Value Ref Range   Color, Urine YELLOW YELLOW   APPearance HAZY (A) CLEAR   Specific Gravity, Urine 1.015 1.005 - 1.030   pH 5.0 5.0 - 8.0   Glucose, UA 50 (A) NEGATIVE mg/dL   Hgb urine dipstick LARGE (A) NEGATIVE   Bilirubin Urine NEGATIVE NEGATIVE   Ketones, ur 20 (A) NEGATIVE mg/dL   Protein, ur NEGATIVE NEGATIVE mg/dL   Nitrite NEGATIVE NEGATIVE   Leukocytes,Ua NEGATIVE NEGATIVE   RBC / HPF >50 0 - 5 RBC/hpf   WBC, UA 0-5 0 - 5 WBC/hpf   Bacteria, UA FEW (A) NONE SEEN   Squamous Epithelial / HPF 0-5 0 - 5 /HPF   Mucus PRESENT    US OB  Transvaginal  Result Date: 09/06/2023 CLINICAL DATA:  [id number] Vaginal bleeding affecting early pregnancy [id number] EXAM: TRANSVAGINAL OB ULTRASOUND TECHNIQUE:  Transvaginal ultrasound was performed for complete evaluation of the gestation as well as the maternal uterus, adnexal regions, and pelvic cul-de-sac. COMPARISON:  08/31/2023 FINDINGS: Intrauterine gestational sac: Single Yolk sac:  Visualized. Embryo:  Not Visualized. Cardiac Activity: Not Visualized. MSD: 7.8 mm   5 w   4 d Subchorionic hemorrhage:  Small-moderate-sized. Maternal uterus/adnexae: Ovaries and adnexal regions within normal limits. No free fluid within the pelvis. IMPRESSION: 1. Early intrauterine gestational sac with yolk sac, but no fetal pole or cardiac activity yet visualized. Recommend follow-up quantitative B-HCG levels and follow-up US in 14 days to assess viability. This recommendation follows SRU consensus guidelines: Diagnostic Criteria for Nonviable Pregnancy Early in the First Trimester. Malva Limes Med 2013; 324:4010-27. 2. Small-moderate-sized subchorionic hemorrhage. Electronically Signed   By: Duanne Guess D.O.   On: 09/06/2023 20:49    MDM - CNM reviewed outside [place] from [facility] on 9/6 and patient noted to have a SCH and GS + YS. She also had a quant of ~3500 on 9/6 - Quant today of 19,000 - With [place] results of notable IUP GS +YS measuring [redacted]w[redacted]d.  - Reviewed large increase in quant with Dr. Lynetta Mare. Per MD patient stable for discharge.  - plan for discharge   Assessment and Plan   1. Subchorionic hematoma, antepartum, single or unspecified fetus   2. [redacted] weeks gestation of pregnancy   3. Vaginal spotting    - Reviewed bleeding expectations of a [facility] and when to return precautions.  - Recommended that based on findings today patient may establish care at her OBGYN of choice.  - Patient discharged home in stable condition and may return to MAU as needed .  Claudette Head, MSN CNM  09/06/2023, 10:43 PM

## 2023-09-06 NOTE — MAU Provider Note (Incomplete)
History     CSN: [id number]  Arrival date and time: 09/06/23 1724   None     Chief Complaint  Patient presents with  . Vaginal Bleeding   Jasmine Avila , a  32 y.o. [phone] at [redacted]w[redacted]d presents to MAU with complaints of bright red vaginal bleeding. Patient states that she was recently diagnosed with a Subchorionic hematoma and was told to report to MAU if the bleeding became bright red. She states that earlier today around 2pm she started having bright red bleeding when she wiped. She denies passing large clots but notes some "specs" in the toilet when she pees. She also reports some intermittent abdominal cramping that comes and goes. She denies having to take anything for pain. She denies recent intercourse and abnormal vaginal discharge or urinary symptoms.          OB History     Gravida  3   Para  2   Term  1   Preterm  1   AB      Living  2      SAB      IAB      Ectopic      Multiple      Live Births  2        Obstetric Comments  Baby # 2 has spina bifida, had surgery inutero , pt had PTL following surgery,          Past Medical History:  Diagnosis Date  . Hashimoto's disease   . Urticaria   . UTI (urinary tract infection)     Past Surgical History:  Procedure Laterality Date  . ADENOIDECTOMY    . BLADDER SURGERY    . CESAREAN SECTION    . HERNIA REPAIR    . OTHER SURGICAL HISTORY  11/2022   tubal reversal, laparotomy  . TONSILLECTOMY    . TUBAL LIGATION    . TYMPANOSTOMY TUBE PLACEMENT      Family History  Problem Relation Age of Onset  . Urticaria Mother   . Healthy Father   . Allergic rhinitis Sister   . Asthma Brother     Social History   Tobacco Use  . Smoking status: Never  . Smokeless tobacco: Never  Substance Use Topics  . Alcohol use: No  . Drug use: No    Allergies:  Allergies  Allergen Reactions  . Sulfa Antibiotics Hives    Medications Prior to Admission  Medication Sig Dispense Refill Last Dose  .  EPINEPHrine (EPIPEN 2-PAK) 0.3 mg/0.3 mL IJ SOAJ injection Inject 0.3 mg into the muscle as needed. 2 each 1   . Fexofenadine HCl (ALLEGRA PO) Take by mouth.       Review of Systems  Constitutional:  Negative for chills, fatigue and fever.  Eyes:  Negative for pain and visual disturbance.  Respiratory:  Negative for apnea, shortness of breath and wheezing.   Cardiovascular:  Negative for chest pain and palpitations.  Gastrointestinal:  Negative for abdominal pain, constipation, diarrhea, nausea and vomiting.  Genitourinary:  Positive for pelvic pain and vaginal bleeding. Negative for difficulty urinating, dysuria, vaginal discharge and vaginal pain.  Musculoskeletal:  Negative for back pain.  Neurological:  Negative for seizures, weakness and headaches.  Psychiatric/Behavioral:  Negative for suicidal ideas.    Physical Exam   Blood pressure 112/68, pulse (!) 105, temperature 98 F (36.7 C), temperature source Oral, resp. rate 18, height 5\' 7"  (1.702 m), weight 58.3 kg, last menstrual period 08/03/2023,  SpO2 100%, unknown if currently breastfeeding.  Physical Exam Vitals and nursing note reviewed.  Constitutional:      General: She is not in acute distress.    Appearance: Normal appearance.  HENT:     Head: Normocephalic.  Pulmonary:     Effort: Pulmonary effort is normal.  Musculoskeletal:     Cervical back: Normal range of motion.  Skin:    General: Skin is warm and dry.  Neurological:     Mental Status: She is alert and oriented to person, place, and time.  Psychiatric:        Mood and Affect: Mood normal.     MAU Course  Procedures Orders Placed This Encounter  Procedures  . Wet prep, genital  . US OB Transvaginal  . CBC  . hCG, quantitative, pregnancy  . Urinalysis, Routine w reflex microscopic -Urine, Clean Catch  . Diet NPO time specified  . ABO/Rh  . Discharge patient   Results for orders placed or performed during the hospital encounter of 09/06/23 (from  the past 24 hour(s))  ABO/Rh     Status: None   Collection Time: 09/06/23  7:24 PM  Result Value Ref Range   ABO/RH(D) A POS    No rh immune globuloin      NOT A RH IMMUNE GLOBULIN CANDIDATE, PT RH POSITIVE Performed at [facility], [address], [place], [place] [address]   CBC     Status: None   Collection Time: 09/06/23  7:26 PM  Result Value Ref Range   WBC 9.7 4.0 - 10.5 K/uL   RBC 4.76 3.87 - 5.11 MIL/uL   Hemoglobin 14.3 12.0 - 15.0 g/dL   HCT 30.8 65.7 - 84.6 %   MCV 89.5 80.0 - 100.0 fL   MCH 30.0 26.0 - 34.0 pg   MCHC 33.6 30.0 - 36.0 g/dL   RDW 96.2 95.2 - 84.1 %   Platelets 286 150 - 400 K/uL   nRBC 0.0 0.0 - 0.2 %  hCG, quantitative, pregnancy     Status: Abnormal   Collection Time: 09/06/23  7:26 PM  Result Value Ref Range   hCG, Beta Chain, Quant, S 19,404 (H) <5 mIU/mL  Wet prep, genital     Status: Abnormal   Collection Time: 09/06/23  8:08 PM  Result Value Ref Range   Yeast Wet Prep HPF POC NONE SEEN NONE SEEN   Trich, Wet Prep NONE SEEN NONE SEEN   Clue Cells Wet Prep HPF POC NONE SEEN NONE SEEN   WBC, Wet Prep HPF POC >=10 (A) <10   Sperm NONE SEEN   Urinalysis, Routine w reflex microscopic -Urine, Clean Catch     Status: Abnormal   Collection Time: 09/06/23  8:30 PM  Result Value Ref Range   Color, Urine YELLOW YELLOW   APPearance HAZY (A) CLEAR   Specific Gravity, Urine 1.015 1.005 - 1.030   pH 5.0 5.0 - 8.0   Glucose, UA 50 (A) NEGATIVE mg/dL   Hgb urine dipstick LARGE (A) NEGATIVE   Bilirubin Urine NEGATIVE NEGATIVE   Ketones, ur 20 (A) NEGATIVE mg/dL   Protein, ur NEGATIVE NEGATIVE mg/dL   Nitrite NEGATIVE NEGATIVE   Leukocytes,Ua NEGATIVE NEGATIVE   RBC / HPF >50 0 - 5 RBC/hpf   WBC, UA 0-5 0 - 5 WBC/hpf   Bacteria, UA FEW (A) NONE SEEN   Squamous Epithelial / HPF 0-5 0 - 5 /HPF   Mucus PRESENT    US OB  Transvaginal  Result Date: 09/06/2023 CLINICAL DATA:  [id number] Vaginal bleeding affecting early pregnancy [id number] EXAM:  TRANSVAGINAL OB ULTRASOUND TECHNIQUE: Transvaginal ultrasound was performed for complete evaluation of the gestation as well as the maternal uterus, adnexal regions, and pelvic cul-de-sac. COMPARISON:  08/31/2023 FINDINGS: Intrauterine gestational sac: Single Yolk sac:  Visualized. Embryo:  Not Visualized. Cardiac Activity: Not Visualized. MSD: 7.8 mm   5 w   4 d Subchorionic hemorrhage:  Small-moderate-sized. Maternal uterus/adnexae: Ovaries and adnexal regions within normal limits. No free fluid within the pelvis. IMPRESSION: 1. Early intrauterine gestational sac with yolk sac, but no fetal pole or cardiac activity yet visualized. Recommend follow-up quantitative B-HCG levels and follow-up US in 14 days to assess viability. This recommendation follows SRU consensus guidelines: Diagnostic Criteria for Nonviable Pregnancy Early in the First Trimester. Malva Limes Med 2013; 027:2536-64. 2. Small-moderate-sized subchorionic hemorrhage. Electronically Signed   By: Duanne Guess D.O.   On: 09/06/2023 20:49    MDM - CNM reviewed outside [place] from [facility] on 9/6 and patient noted to have a SCH and GS + YS. She also had a quant of ~3500 on 9/6 - Quant today of 19,000 - With [place] results of notable IUP GS +YS   Assessment and Plan  ***  Claudette Head 09/06/2023, 10:43 PM

## 2023-09-06 NOTE — MAU Note (Signed)
Jasmine Avila is a 32 y.o. at [redacted]w[redacted]d here in MAU reporting: she was diagnosed with a subchorionic hemorrhage on Friday and began having red spotting with wiping today.  States began having pink spotting on Thursday night, hence ultrasound on Friday.  Reports hasn't needed to wear a sanitary napkin, wearing a panty liner that has not  needed to be changed all day.   LMP: 08/03/2023 Onset of complaint: today Pain score: 1 Vitals:   09/06/23 1745  BP: 112/68  Pulse: (!) 105  Resp: 18  Temp: 98 F (36.7 C)  SpO2: 100%     FHT:NA Lab orders placed from triage:   None

## 2023-09-07 LAB — GC/CHLAMYDIA PROBE AMP (~~LOC~~) NOT AT ARMC
Chlamydia: NEGATIVE
Comment: NEGATIVE
Comment: NORMAL
Neisseria Gonorrhea: NEGATIVE

## 2023-09-28 ENCOUNTER — Telehealth: Payer: Self-pay

## 2023-09-28 NOTE — Telephone Encounter (Signed)
Patient called stating she recently found out she is pregnant. She has not had her xolair shot for hives since aug 7th because she wanted to wait until her appt with OBGYN.   She wants to know if she can continue her shots during pregnancy.  She states after being off them for a few weeks her face is flushed and it burns.    Please advice. Thank you.

## 2023-09-29 NOTE — Telephone Encounter (Signed)
My Chart message sent

## 2023-10-05 ENCOUNTER — Inpatient Hospital Stay (HOSPITAL_COMMUNITY)
Admission: AD | Admit: 2023-10-05 | Discharge: 2023-10-05 | Disposition: A | Discharge status: Home / Self Care | Payer: 59 | Attending: Obstetrics and Gynecology | Admitting: Obstetrics and Gynecology

## 2023-10-05 DIAGNOSIS — Z87440 Personal history of urinary (tract) infections: Secondary | ICD-10-CM | POA: Diagnosis not present

## 2023-10-05 DIAGNOSIS — Z3A09 9 weeks gestation of pregnancy: Secondary | ICD-10-CM

## 2023-10-05 DIAGNOSIS — O209 Hemorrhage in early pregnancy, unspecified: Secondary | ICD-10-CM

## 2023-10-05 DIAGNOSIS — Z882 Allergy status to sulfonamides status: Secondary | ICD-10-CM | POA: Insufficient documentation

## 2023-10-05 DIAGNOSIS — Z9089 Acquired absence of other organs: Secondary | ICD-10-CM | POA: Diagnosis not present

## 2023-10-05 LAB — CBC
HCT: 41.2 % (ref 36.0–46.0)
Hemoglobin: 13.9 g/dL (ref 12.0–15.0)
MCH: 29.4 pg (ref 26.0–34.0)
MCHC: 33.7 g/dL (ref 30.0–36.0)
MCV: 87.1 fL (ref 80.0–100.0)
Platelets: 271 10*3/uL (ref 150–400)
RBC: 4.73 MIL/uL (ref 3.87–5.11)
RDW: 12 % (ref 11.5–15.5)
WBC: 8.7 10*3/uL (ref 4.0–10.5)
nRBC: 0 % (ref 0.0–0.2)

## 2023-10-05 LAB — ABO/RH: ABO/RH(D): A POS

## 2023-10-05 LAB — HCG, QUANTITATIVE, PREGNANCY: hCG, Beta Chain, Quant, S: 153015 m[IU]/mL — ABNORMAL HIGH (ref <5)

## 2023-10-05 NOTE — Discharge Instructions (Signed)
Your ultrasound shows a normal pregnancy with normal cardiac activity. You are likely bleeding from a subchorionic hematoma, which you have had before. We discussed that vaginal bleeding in the first trimester is common, and that 80-90% of patients will go on to have a normal pregnancy with a live delivery. The remainder are at increased risk for miscarriage, unfortunately there are no known interventions to mitigate this risk. We discussed return precautions including crescendo abdominal pain, heavy vaginal bleeding soaking >1 pad/hour, and fever.

## 2023-10-05 NOTE — MAU Note (Signed)
Jasmine Avila is a 32 y.o. at [redacted]w[redacted]d here in MAU reporting: around 0130, she got up to pee, when she got back to bed, she saw more. Went back to bathroom to clean up, saw more in the toilet.  Bleeding has been light since. Called OB, was able to get in, they did a pelvic, could see light bleeding. Were unable to do [place] in office ([facility] in [place], 10hr wt at [place]), was sent here.  Random twinge here and there, has had throughout preg.  Had subchorionic hemorrhage earlier in preg, was told resolved. Had been straining with BM when had gush of blood earlier. Onset of complaint: 0130 Pain score: 1/10 Vitals:   10/05/23 1212  BP: (!) 112/38  Pulse: (!) 120  Resp: 16  Temp: 98.1 F (36.7 C)  SpO2: 99%      Lab orders placed from triage:

## 2023-10-05 NOTE — MAU Provider Note (Signed)
History     [id number]  Arrival date and time: 10/05/23 1150    Chief Complaint  Patient presents with   Vaginal Bleeding     HPI Jasmine Avila is a 32 y.o. at [redacted]w[redacted]d by 5wk [place] with PMHx notable for cesarean, hx of tubal reversal, who presents for vaginal bleeding.   Last seen in MAU on 09/06/2023, diagnosed with [facility] at that time and discharged  Today reports that around 0130 she went to bathroom and noticed vaginal bleeding that soaked through underwear No clots No significant abdominal pain No burning or pain with urination No recent intercourse  --/--/A POS (10/07 1245)  OB History     Gravida  3   Para  2   Term  1   Preterm  1   AB      Living  2      SAB      IAB      Ectopic      Multiple      Live Births  2        Obstetric Comments  Baby # 2 has spina bifida, had surgery inutero , pt had PTL following surgery,          Past Medical History:  Diagnosis Date   Hashimoto's disease    Urticaria    UTI (urinary tract infection)     Past Surgical History:  Procedure Laterality Date   ADENOIDECTOMY     BLADDER SURGERY     CESAREAN SECTION     HERNIA REPAIR     OTHER SURGICAL HISTORY  11/2022   tubal reversal, laparotomy   TONSILLECTOMY     TUBAL LIGATION     TYMPANOSTOMY TUBE PLACEMENT      Family History  Problem Relation Age of Onset   Urticaria Mother    Healthy Father    Allergic rhinitis Sister    Asthma Brother     Social History   Socioeconomic History   Marital status: Married    Spouse name: Not on file   Number of children: Not on file   Years of education: Not on file   Highest education level: Not on file  Occupational History   Not on file  Tobacco Use   Smoking status: Never   Smokeless tobacco: Never  Substance and Sexual Activity   Alcohol use: No   Drug use: No   Sexual activity: Yes  Other Topics Concern   Not on file  Social History Narrative   Not on file   Social Determinants of Health    Financial Resource Strain: Not on file  Food Insecurity: Not on file  Transportation Needs: Not on file  Physical Activity: Not on file  Stress: Not on file  Social Connections: Not on file  Intimate Partner Violence: Not on file    Allergies  Allergen Reactions   Sulfa Antibiotics Hives    No current facility-administered medications on file prior to encounter.   Current Outpatient Medications on File Prior to Encounter  Medication Sig Dispense Refill   EPINEPHrine (EPIPEN 2-PAK) 0.3 mg/0.3 mL IJ SOAJ injection Inject 0.3 mg into the muscle as needed. 2 each 1   Fexofenadine HCl (ALLEGRA PO) Take by mouth.       ROS Pertinent positives and negative per HPI, all others reviewed and negative  Physical Exam   BP (!) 112/38 (BP Location: Right Arm)   Pulse (!) 120   Temp 98.1 F (36.7 C) (Oral)  Resp 16   Ht 5\' 7"  (1.702 m)   Wt 57.8 kg   LMP 08/03/2023   SpO2 99%   BMI 19.97 kg/m   Patient Vitals for the past 24 hrs:  BP Temp Temp src Pulse Resp SpO2 Height Weight  10/05/23 1212 (!) 112/38 98.1 F (36.7 C) Oral (!) 120 16 99 % 5\' 7"  (1.702 m) 57.8 kg    Physical Exam Vitals reviewed.  Constitutional:      General: She is not in acute distress.    Appearance: She is well-developed. She is not diaphoretic.  Eyes:     General: No scleral icterus. Pulmonary:     Effort: Pulmonary effort is normal. No respiratory distress.  Abdominal:     General: There is no distension.     Palpations: Abdomen is soft.     Tenderness: There is no abdominal tenderness. There is no guarding or rebound.  Skin:    General: Skin is warm and dry.  Neurological:     Mental Status: She is alert.     Coordination: Coordination normal.      Cervical Exam    Bedside Ultrasound Pt informed that the ultrasound is considered a limited OB ultrasound and is not intended to be a complete ultrasound exam.  Patient also informed that the ultrasound is not being completed with the  intent of assessing for fetal or placental anomalies or any pelvic abnormalities.  Explained that the purpose of today's ultrasound is to assess for  viability.  Patient acknowledges the purpose of the exam and the limitations of the study.      My interpretation: First trimester findings: Intrauterine gestational sac seen: yes Gestational sac summary: fetal pole seen, fetal cardiac activity ~180-190's by M mode  Labs Results for orders placed or performed during the hospital encounter of 10/05/23 (from the past 24 hour(s))  ABO/Rh     Status: None   Collection Time: 10/05/23 12:45 PM  Result Value Ref Range   ABO/RH(D) A POS    No rh immune globuloin      NOT A RH IMMUNE GLOBULIN CANDIDATE, PT RH POSITIVE Performed at [facility], [address], [place], [place] [address]   CBC     Status: None   Collection Time: 10/05/23 12:48 PM  Result Value Ref Range   WBC 8.7 4.0 - 10.5 K/uL   RBC 4.73 3.87 - 5.11 MIL/uL   Hemoglobin 13.9 12.0 - 15.0 g/dL   HCT 60.4 54.0 - 98.1 %   MCV 87.1 80.0 - 100.0 fL   MCH 29.4 26.0 - 34.0 pg   MCHC 33.7 30.0 - 36.0 g/dL   RDW 19.1 47.8 - 29.5 %   Platelets 271 150 - 400 K/uL   nRBC 0.0 0.0 - 0.2 %    Imaging No results found.  MAU Course  Procedures Lab Orders         Wet prep, genital         CBC         hCG, quantitative, pregnancy    No orders of the defined types were placed in this encounter.  Imaging Orders  No imaging studies ordered today    MDM Moderate (Level 3-4)  Assessment and Plan  #Vaginal bleeding in pregnancy, first trimester #[redacted] weeks gestation of pregnancy US shows viable IUP. We discussed that vaginal bleeding in the first trimester is common, and that 80-90% of patients will go on to have a normal pregnancy with  a live delivery. The remainder are at increased risk for miscarriage, unfortunately there are no known interventions to mitigate this risk. --/--/A POS (10/07 1245), rhogam not indicated. We  discussed return precautions including crescendo abdominal pain, heavy vaginal bleeding soaking >1 pad/hour, and fever.   Dispo: discharged to home in stable condition    Venora Maples, MD/MPH 10/05/23 2:06 PM  Allergies as of 10/05/2023       Reactions   Sulfa Antibiotics Hives        Medication List     TAKE these medications    ALLEGRA PO Take by mouth.   EPINEPHrine 0.3 mg/0.3 mL Soaj injection Commonly known as: EpiPen 2-Pak Inject 0.3 mg into the muscle as needed.

## 2023-10-31 ENCOUNTER — Inpatient Hospital Stay (HOSPITAL_COMMUNITY)
Admission: AD | Admit: 2023-10-31 | Discharge: 2023-10-31 | Disposition: A | Discharge status: Home / Self Care | Payer: 59 | Attending: Obstetrics and Gynecology | Admitting: Obstetrics and Gynecology

## 2023-10-31 ENCOUNTER — Other Ambulatory Visit: Payer: Self-pay

## 2023-10-31 ENCOUNTER — Inpatient Hospital Stay (HOSPITAL_COMMUNITY): Payer: 59

## 2023-10-31 DIAGNOSIS — Z3A13 13 weeks gestation of pregnancy: Secondary | ICD-10-CM | POA: Diagnosis not present

## 2023-10-31 DIAGNOSIS — N939 Abnormal uterine and vaginal bleeding, unspecified: Secondary | ICD-10-CM | POA: Diagnosis not present

## 2023-10-31 DIAGNOSIS — O208 Other hemorrhage in early pregnancy: Secondary | ICD-10-CM | POA: Diagnosis present

## 2023-10-31 LAB — CBC
HCT: 36.8 % (ref 36.0–46.0)
Hemoglobin: 12.6 g/dL (ref 12.0–15.0)
MCH: 30.1 pg (ref 26.0–34.0)
MCHC: 34.2 g/dL (ref 30.0–36.0)
MCV: 87.8 fL (ref 80.0–100.0)
Platelets: 224 10*3/uL (ref 150–400)
RBC: 4.19 MIL/uL (ref 3.87–5.11)
RDW: 12.5 % (ref 11.5–15.5)
WBC: 11.3 10*3/uL — ABNORMAL HIGH (ref 4.0–10.5)
nRBC: 0 % (ref 0.0–0.2)

## 2023-10-31 LAB — HCG, QUANTITATIVE, PREGNANCY: hCG, Beta Chain, Quant, S: 115626 m[IU]/mL — ABNORMAL HIGH (ref <5)

## 2023-10-31 LAB — WET PREP, GENITAL
Clue Cells Wet Prep HPF POC: NONE SEEN
Sperm: NONE SEEN
Trich, Wet Prep: NONE SEEN
WBC, Wet Prep HPF POC: >=10 — AB (ref <10)
Yeast Wet Prep HPF POC: NONE SEEN

## 2023-10-31 NOTE — MAU Provider Note (Signed)
History     CSN: [id number]  Arrival date and time: 10/31/23 0841   Event Date/Time   First Provider Initiated Contact with Patient 10/31/23 [phone]      Chief Complaint  Patient presents with   Vaginal Bleeding   HPI Jasmine Avila is a 32 y.o. [id number] at [redacted]w[redacted]d who presents with complaint of vaginal bleeding. Pt with known hx [facility] measuring 2.6cm near cervix per chart review. She has been having spotting throughout pregnancy thus far. No bleeding 10/28 to 11/1, then woke up this AM with moderate amount of vaginal bleeding -- described as burgundy colored, 8cm round area on pad. She reports mild nausea and abdominal cramping. No lightheadedness, dizziness, f/c, c/d, urinary sxs. No recent intercourse.  Past Medical History:  Diagnosis Date   Hashimoto's disease    Urticaria    UTI (urinary tract infection)     Past Surgical History:  Procedure Laterality Date   ADENOIDECTOMY     BLADDER SURGERY     CESAREAN SECTION     HERNIA REPAIR     OTHER SURGICAL HISTORY  11/2022   tubal reversal, laparotomy   TONSILLECTOMY     TUBAL LIGATION     TYMPANOSTOMY TUBE PLACEMENT      Family History  Problem Relation Age of Onset   Urticaria Mother    Healthy Father    Allergic rhinitis Sister    Asthma Brother     Social History   Tobacco Use   Smoking status: Never   Smokeless tobacco: Never  Substance Use Topics   Alcohol use: No   Drug use: No    Allergies:  Allergies  Allergen Reactions   Sulfa Antibiotics Hives    Medications Prior to Admission  Medication Sig Dispense Refill Last Dose   EPINEPHrine (EPIPEN 2-PAK) 0.3 mg/0.3 mL IJ SOAJ injection Inject 0.3 mg into the muscle as needed. 2 each 1    Fexofenadine HCl (ALLEGRA PO) Take by mouth.      ROS reviewed and pertinent positives and negatives as documented in HPI.  Physical Exam   Blood pressure 102/63, pulse 95, temperature 98 F (36.7 C), temperature source Oral, resp. rate 18, height 5\' 7"  (1.702 m),  weight 59.9 kg, last menstrual period 08/03/2023, SpO2 100%, unknown if currently breastfeeding.  Physical Exam Constitutional:      General: She is not in acute distress.    Appearance: Normal appearance. She is not ill-appearing.  HENT:     Head: Normocephalic and atraumatic.  Cardiovascular:     Rate and Rhythm: Normal rate.  Pulmonary:     Effort: Pulmonary effort is normal.     Breath sounds: Normal breath sounds.  Abdominal:     Palpations: Abdomen is soft.     Tenderness: There is no abdominal tenderness. There is no guarding.  Musculoskeletal:        General: Normal range of motion.  Skin:    General: Skin is warm and dry.     Findings: No rash.  Neurological:     General: No focal deficit present.     Mental Status: She is alert and oriented to person, place, and time.     MAU Course  Procedures  MDM 32 y.o. [id number] at [redacted]w[redacted]d who presents with VB in setting of known IUP and hx [facility]. She is hemodynamically stable and exam reassuring. Will check HgB, swabs, U/S. Suspect 2/2 SCH.  11:36 AM  Results back -- HgB ok, Rh pos, wet prep neg,  U/S w IUP, small SCH. Discussed findings with pt. Also reinforced reasons to return to MAU. Stable for d/c.  Assessment and Plan  Vaginal bleeding - Plan: Discharge patient  Subchorionic hemorrhage of placenta in first trimester - Plan: Discharge patient VB likely iso [facility], discussed results and signs to look out for with pt Stable for d/c F/up with primary OB   Osceola Holian 10/31/2023, 9:44 AM

## 2023-10-31 NOTE — MAU Note (Signed)
Jasmine Avila is a 31 y.o. at [redacted]w[redacted]d here in MAU reporting: she woke up this morning and noted moderate dark blood on sanitary napkin.  Denies recent intercourse.  Reports has Hx of subchorionic hemorrhage, but VB stopped 3 days ago.  Endorses mild abdominal cramping. LMP: NA Onset of complaint: today Pain score: 2 Vitals:   10/31/23 0901  BP: 102/63  Pulse: 95  Resp: 18  Temp: 98 F (36.7 C)  SpO2: 100%     FHT:169 bpm Lab orders placed from triage:   None

## 2023-11-02 LAB — GC/CHLAMYDIA PROBE AMP (~~LOC~~) NOT AT ARMC
Chlamydia: NEGATIVE
Comment: NEGATIVE
Comment: NORMAL
Neisseria Gonorrhea: NEGATIVE

## 2023-12-31 ENCOUNTER — Ambulatory Visit: Payer: 59 | Admitting: Allergy and Immunology

## 2024-01-20 ENCOUNTER — Ambulatory Visit: Payer: 59 | Admitting: Allergy and Immunology

## 2024-02-25 ENCOUNTER — Ambulatory Visit: Payer: 59 | Admitting: Allergy and Immunology

## 2024-05-05 ENCOUNTER — Ambulatory Visit: Admitting: Allergy and Immunology

## 2024-05-05 ENCOUNTER — Encounter: Payer: Self-pay | Admitting: Allergy and Immunology

## 2024-05-05 VITALS — BP 110/60 | HR 103 | Resp 16 | Ht 66.0 in | Wt 155.0 lb

## 2024-05-05 DIAGNOSIS — E063 Autoimmune thyroiditis: Secondary | ICD-10-CM

## 2024-05-05 DIAGNOSIS — L501 Idiopathic urticaria: Secondary | ICD-10-CM | POA: Diagnosis not present

## 2024-05-05 MED ORDER — DUPILUMAB 300 MG/2ML ~~LOC~~ SOSY
600.0000 mg | PREFILLED_SYRINGE | Freq: Once | SUBCUTANEOUS | Status: AC
Start: 2024-05-05 — End: 2024-05-05
  Administered 2024-05-05: 600 mg via SUBCUTANEOUS

## 2024-05-05 NOTE — Progress Notes (Signed)
   [place] - [place] - [place] - [place] - [place]   Follow-up Note  Referring Provider: Lucius Sabins., MD Primary Provider: Lucius Sabins., MD Date of Office Visit: 05/05/2024  Subjective:   Jasmine Avila (DOB: 03/10/1991) is a 33 y.o. female who [facility] in re-evaluation of the following:  HPI: Jasmine Avila returns to this clinic in evaluation of chronic urticaria and history of Hashimoto's thyroiditis..  I last saw her in this clinic 08 July 2023.  While she was pregnant she had no problems with her hives and stopped all of her medicines.  Since she has delivered 3 weeks ago she has starting to develop hives once again.  She was never that impressed that omalizumab  actually helped control her urticaria.  Allergies as of 05/05/2024       Reactions   Sulfa Antibiotics Hives        Medication List    ALLEGRA PO Take by mouth.   EPINEPHrine  0.3 mg/0.3 mL Soaj injection Commonly known as: EpiPen  2-Pak Inject 0.3 mg into the muscle as needed.    Past Medical History:  Diagnosis Date   Hashimoto's disease    Urticaria    UTI (urinary tract infection)     Past Surgical History:  Procedure Laterality Date   ADENOIDECTOMY     BLADDER SURGERY     CESAREAN SECTION     HERNIA REPAIR     OTHER SURGICAL HISTORY  11/2022   tubal reversal, laparotomy   TONSILLECTOMY     TUBAL LIGATION     TYMPANOSTOMY TUBE PLACEMENT      Review of systems negative except as noted in HPI / PMHx or noted below:  Review of Systems  Constitutional: Negative.   HENT: Negative.    Eyes: Negative.   Respiratory: Negative.    Cardiovascular: Negative.   Gastrointestinal: Negative.   Genitourinary: Negative.   Musculoskeletal: Negative.   Skin: Negative.   Neurological: Negative.   Endo/Heme/Allergies: Negative.   Psychiatric/Behavioral: Negative.       Objective:   Vitals:   05/05/24 1351  BP: 110/60  Pulse: (!) 103   Resp: 16  SpO2: 99%   Height: 5\' 6"  (167.6 cm)  Weight: 155 lb (70.3 kg)   Physical Exam Skin:    Findings: Rash (blotchy red patches extermities) present.     Diagnostics: none  Assessment and Plan:   1. Idiopathic urticaria   2. Hashimoto's thyroiditis    1. Fexofenadine 180 mg  - 1 tablets 1-2 times per day    2. Start Dupilumab  injections for urticaria. Dose today & [name] follow up.  3. Return to clinic 6 months or earlier if problem  4. Influenza = Tamiflu. Covid = Paxlovid  5. Check TSH, FT4, Thyroid  Peroxidase antibody July 2025  Jasmine Avila will use dupilumab  to control her urticaria and we will dose her today and then our Biologics coordinator can arrange for insurance approval for future doses.  In July we will recheck her thyroid  function and assess her status of Hashimoto's at that point.  I will see her back in this clinic in 6 months.  Schuyler Custard, MD Allergy  / Immunology [facility]

## 2024-05-05 NOTE — Patient Instructions (Addendum)
  1. Fexofenadine 180 mg  - 1 tablets 1-2 times per day    2. Start Dupilumab injections for urticaria. Dose today & [name] follow up.  3. Return to clinic 6 months or earlier if problem  4. Influenza = Tamiflu. Covid = Paxlovid  5. Check TSH, FT4, Thyroid  Peroxidase antibody July 2025

## 2024-05-05 NOTE — Progress Notes (Signed)
 Immunotherapy   Patient Details  Name: Naiyana G Britten MRN: [id number] Date of Birth: [birth year]  05/05/2024  Nithila G Chio started Dupixent injections for urticaria. Patient received 600 mg loading dose. Patient would like to do them at home. Teaching was provided and patient's husband administered injection without any issues. Patient instructions given.   Itai Barbian 05/05/2024, 2:00 PM

## 2024-05-09 ENCOUNTER — Encounter: Payer: Self-pay | Admitting: Allergy and Immunology

## 2024-05-26 ENCOUNTER — Telehealth: Payer: Self-pay | Admitting: *Deleted

## 2024-05-26 NOTE — Telephone Encounter (Signed)
-----   Message from [facility] Marion G sent at 05/05/2024  2:02 PM EDT ----- Regarding: Dupixent  Dr. Kozlow started patient on Dupixent  for Urticaria. Patient received loading dose and would like to do them at home. Teaching was provided and husband administered injection without any issues.

## 2024-05-26 NOTE — Telephone Encounter (Signed)
 Patint had called about her Dupixent  and I advised that her Ins had denied same due to them not updating the new indication [facility] will need to appeal patient to come by and sign rep form

## 2024-06-09 ENCOUNTER — Telehealth: Payer: Self-pay | Admitting: *Deleted

## 2024-06-09 NOTE — Telephone Encounter (Signed)
 Called patient and advised approval copay card and submit to [employer] for Dupixent . Had to appeal same due to new indication. Given instructions on delivery, storage and dosing for same

## 2024-11-02 ENCOUNTER — Ambulatory Visit: Admitting: Allergy and Immunology
